# Patient Record
Sex: Female | Born: 1985 | Race: White | Hispanic: No | Marital: Married | Smoking: Never smoker
Health system: Southern US, Community
[De-identification: ages and names within clinical notes are randomized; demographics above are authoritative.]

## PROBLEM LIST (undated history)

## (undated) DIAGNOSIS — F3281 Premenstrual dysphoric disorder: Secondary | ICD-10-CM

## (undated) DIAGNOSIS — J45909 Unspecified asthma, uncomplicated: Secondary | ICD-10-CM

## (undated) DIAGNOSIS — E559 Vitamin D deficiency, unspecified: Secondary | ICD-10-CM

## (undated) DIAGNOSIS — R011 Cardiac murmur, unspecified: Secondary | ICD-10-CM

## (undated) HISTORY — PX: WISDOM TOOTH EXTRACTION: SHX21

## (undated) HISTORY — DX: Vitamin D deficiency, unspecified: E55.9

## (undated) HISTORY — DX: Premenstrual dysphoric disorder: F32.81

## (undated) HISTORY — PX: HERNIA REPAIR: SHX51

---
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
## 2014-08-16 ENCOUNTER — Ambulatory Visit (INDEPENDENT_AMBULATORY_CARE_PROVIDER_SITE_OTHER): Payer: BC Managed Care – PPO | Admitting: Physician Assistant

## 2014-08-16 ENCOUNTER — Encounter: Payer: Self-pay | Admitting: Physician Assistant

## 2014-08-16 VITALS — BP 119/77 | HR 95 | Ht 62.0 in | Wt 108.0 lb

## 2014-08-16 DIAGNOSIS — F418 Other specified anxiety disorders: Secondary | ICD-10-CM

## 2014-08-16 DIAGNOSIS — F419 Anxiety disorder, unspecified: Secondary | ICD-10-CM | POA: Insufficient documentation

## 2014-08-16 DIAGNOSIS — F329 Major depressive disorder, single episode, unspecified: Secondary | ICD-10-CM | POA: Insufficient documentation

## 2014-08-16 DIAGNOSIS — J45991 Cough variant asthma: Secondary | ICD-10-CM | POA: Diagnosis not present

## 2014-08-16 DIAGNOSIS — E785 Hyperlipidemia, unspecified: Secondary | ICD-10-CM

## 2014-08-16 DIAGNOSIS — Z23 Encounter for immunization: Secondary | ICD-10-CM | POA: Diagnosis not present

## 2014-08-16 DIAGNOSIS — K219 Gastro-esophageal reflux disease without esophagitis: Secondary | ICD-10-CM | POA: Insufficient documentation

## 2014-08-16 DIAGNOSIS — D509 Iron deficiency anemia, unspecified: Secondary | ICD-10-CM | POA: Diagnosis not present

## 2014-08-16 DIAGNOSIS — F32A Depression, unspecified: Secondary | ICD-10-CM

## 2014-08-16 DIAGNOSIS — J302 Other seasonal allergic rhinitis: Secondary | ICD-10-CM

## 2014-08-16 DIAGNOSIS — N943 Premenstrual tension syndrome: Secondary | ICD-10-CM

## 2014-08-16 DIAGNOSIS — F3281 Premenstrual dysphoric disorder: Secondary | ICD-10-CM

## 2014-08-16 MED ORDER — PANTOPRAZOLE SODIUM 20 MG PO TBEC: 40.0000 mg | DELAYED_RELEASE_TABLET | Freq: Every day | ORAL | Status: DC

## 2014-08-16 MED ORDER — SERTRALINE HCL 25 MG PO TABS: 25.0000 mg | ORAL_TABLET | Freq: Every day | ORAL | Status: DC

## 2014-08-16 NOTE — Progress Notes (Signed)
Subjective:    Patient ID: Melissa Mcclain, female    DOB: June 17, 1986, 28 y.o.   MRN: [id number]  HPI Patient is a 28 year old female presents to the clinic to establish care.  .. Active Ambulatory Problems    Diagnosis Date Noted  . IDA (iron deficiency anemia) 08/16/2014  . Seasonal allergies 08/16/2014  . Asthma, cough variant 08/16/2014  . PMDD (premenstrual dysphoric disorder) 08/16/2014  . Anxiety and depression 08/16/2014  . Gastroesophageal reflux disease without esophagitis 08/16/2014  . Hyperlipidemia 08/16/2014   Resolved Ambulatory Problems    Diagnosis Date Noted  . No Resolved Ambulatory Problems   No Additional Past Medical History   .Marland Kitchen Family History  Problem Relation Age of Onset  . Hyperlipidemia Mother   . Alcohol abuse Father   . Depression Brother   . Alcohol abuse Maternal Aunt   . Alcohol abuse Maternal Uncle   . Alcohol abuse Paternal Aunt   . Hyperlipidemia Maternal Grandmother   . Alcohol abuse Maternal Grandfather   . Cancer Maternal Grandfather   . Cancer Paternal Grandmother   . Alcohol abuse Paternal Aunt    .Marland Kitchen History   Social History  . Marital Status: Married    Spouse Name: N/A    Number of Children: N/A  . Years of Education: N/A   Occupational History  . Not on file.   Social History Main Topics  . Smoking status: Never Smoker   . Smokeless tobacco: Not on file  . Alcohol Use: 0.0 oz/week    0 Not specified per week  . Drug Use: No  . Sexual Activity: Yes   Other Topics Concern  . Not on file   Social History Narrative  . No narrative on file   Patient sees multiple doctors and really only needs me to manage her anxiety and depression as well as GERD.  Anxiety and depression she feels like is fairly controlled today at Zoloft 25 mg once daily. She has been on this for at least 2 years. She has tried going off it in the past and did not work out. She denies any suicidal or homicidal thoughts.  GERD-she's not  taking daily but taking on a regular basis. Her symptoms have resolved since starting protonic since.   Review of Systems  All other systems reviewed and are negative.      Objective:   Physical Exam  Constitutional: She is oriented to person, place, and time. She appears well-developed and well-nourished.  HENT:  Head: Normocephalic and atraumatic.  Cardiovascular: Normal rate, regular rhythm and normal heart sounds.   Pulmonary/Chest: Effort normal and breath sounds normal. She has no wheezes.  Neurological: She is alert and oriented to person, place, and time.  Skin: Skin is dry.  Psychiatric: She has a normal mood and affect. Her behavior is normal.          Assessment & Plan:  IDA-managed by her integrative homeopathic doctor.  Asthma with cough/seasonal allergies- managed by allergist Dr. Clarene Essex   anxiety and depression-GAD-7 was 14 pH Q9 was 5. Refilled Zoloft 25 mg discussed since numbers are not necessarily controlled increased. Patient declined at this time. Will refill for 6 months in follow-up as needed. Patient does want counseling if she were to become pregnant. I did tell her that her Zoloft is considered safe for pregnancy. If she does think pregnancy is in the future she should start a prenatal vitamin.  GERD-refilled Protonix. Reassured patient that protonic since safe for pregnancy.  She is aware that no studies were done during first trimester.  Flu shot given without complication. Tetanus shot given without complication.

## 2014-08-28 ENCOUNTER — Other Ambulatory Visit: Payer: Self-pay | Admitting: *Deleted

## 2014-08-28 MED ORDER — VITAMIN D (ERGOCALCIFEROL) 1.25 MG (50000 UNIT) PO CAPS: 50000.0000 [IU] | ORAL_CAPSULE | ORAL | Status: DC

## 2014-09-01 NOTE — L&D Delivery Note (Signed)
Para Asti is a 29 y.o. female G1P0 with IUP at [redacted]w[redacted]d by LMP presenting for contractions, found to be in latent labor wanting an water Birth initial. However, patient decided upon epidural. Augmented with pitocin, followed by AROM. Patient was complete and pushed for approximately 3 hours.   Delivery Note At 7:53 PM a viable female was delivered via Vaginal, Vacuum (Extractor) (Presentation: ; Occiput Anterior).  APGAR: 8,9 ; weight 8lb 2 oz Placenta status: Intact, Spontaneous.  Cord: 3 vessels with the following complications: None.  Cord pH: Not collected   Anesthesia: Epidural  Episiotomy: None Lacerations:  2nd degree  Suture Repair: 3.0 vicryl Est. Blood Loss (mL):  250  Mom to postpartum.  Baby to Couplet care / Skin to Skin.  Lockie Pares 08/20/2015, 8:22 PM  I performed the vacuum assisted delivery secondary to maternal exhaustion and repaired the 2nd degree laceration. Agree with above delivery note.

## 2014-12-05 ENCOUNTER — Telehealth: Payer: Self-pay | Admitting: Physician Assistant

## 2014-12-05 NOTE — Telephone Encounter (Signed)
Patient states her employer wants her to have labs.  She was wondering if you would put orders in for that bloodwork.  She needs basic labs and lipid panel.  I told her that if you needed to see her for some reason, i would let her know she needs appt.  Otherwise, let me know when labs are ordered and i will call her.  thanks

## 2014-12-06 ENCOUNTER — Telehealth: Payer: Self-pay | Admitting: *Deleted

## 2014-12-06 DIAGNOSIS — E785 Hyperlipidemia, unspecified: Secondary | ICD-10-CM

## 2014-12-06 DIAGNOSIS — D508 Other iron deficiency anemias: Secondary | ICD-10-CM

## 2014-12-06 NOTE — Telephone Encounter (Signed)
Ok to order lipid panel, cmp, cbc, ferritin, iron panel.

## 2014-12-06 NOTE — Telephone Encounter (Signed)
Labs ordered.

## 2014-12-15 ENCOUNTER — Ambulatory Visit (INDEPENDENT_AMBULATORY_CARE_PROVIDER_SITE_OTHER): Payer: BLUE CROSS/BLUE SHIELD | Admitting: Physician Assistant

## 2014-12-15 ENCOUNTER — Encounter: Payer: Self-pay | Admitting: Physician Assistant

## 2014-12-15 VITALS — BP 123/86 | HR 96 | Wt 102.0 lb

## 2014-12-15 DIAGNOSIS — Z331 Pregnant state, incidental: Secondary | ICD-10-CM

## 2014-12-15 DIAGNOSIS — Z3201 Encounter for pregnancy test, result positive: Secondary | ICD-10-CM

## 2014-12-15 DIAGNOSIS — Z3490 Encounter for supervision of normal pregnancy, unspecified, unspecified trimester: Secondary | ICD-10-CM

## 2014-12-15 LAB — CBC WITH DIFFERENTIAL/PLATELET
Basophils Absolute: 0 10*3/uL (ref 0.0–0.1)
Basophils Relative: 0 % (ref 0–1)
Eosinophils Absolute: 0.1 10*3/uL (ref 0.0–0.7)
Eosinophils Relative: 1 % (ref 0–5)
HCT: 41.6 % (ref 36.0–46.0)
Hemoglobin: 14.2 g/dL (ref 12.0–15.0)
Lymphocytes Relative: 36 % (ref 12–46)
Lymphs Abs: 2.3 10*3/uL (ref 0.7–4.0)
MCH: 32.2 pg (ref 26.0–34.0)
MCHC: 34.1 g/dL (ref 30.0–36.0)
MCV: 94.3 fL (ref 78.0–100.0)
MPV: 11.1 fL (ref 8.6–12.4)
Monocytes Absolute: 0.4 10*3/uL (ref 0.1–1.0)
Monocytes Relative: 7 % (ref 3–12)
Neutro Abs: 3.5 10*3/uL (ref 1.7–7.7)
Neutrophils Relative %: 56 % (ref 43–77)
Platelets: 248 10*3/uL (ref 150–400)
RBC: 4.41 MIL/uL (ref 3.87–5.11)
RDW: 12.7 % (ref 11.5–15.5)
WBC: 6.3 10*3/uL (ref 4.0–10.5)

## 2014-12-15 LAB — POCT URINE PREGNANCY: Preg Test, Ur: POSITIVE

## 2014-12-15 NOTE — Patient Instructions (Addendum)
First Trimester of Pregnancy The first trimester of pregnancy is from week 1 until the end of week 12 (months 1 through 3). A week after a sperm fertilizes an egg, the egg will implant on the wall of the uterus. This embryo will begin to develop into a baby. Genes from you and your partner are forming the baby. The female genes determine whether the baby is a boy or a girl. At 6-8 weeks, the eyes and face are formed, and the heartbeat can be seen on ultrasound. At the end of 12 weeks, all the baby's organs are formed.  Now that you are pregnant, you will want to do everything you can to have a healthy baby. Two of the most important things are to get good prenatal care and to follow your health care provider's instructions. Prenatal care is all the medical care you receive before the baby's birth. This care will help prevent, find, and treat any problems during the pregnancy and childbirth. BODY CHANGES Your body goes through many changes during pregnancy. The changes vary from woman to woman.   You may gain or lose a couple of pounds at first.  You may feel sick to your stomach (nauseous) and throw up (vomit). If the vomiting is uncontrollable, call your health care provider.  You may tire easily.  You may develop headaches that can be relieved by medicines approved by your health care provider.  You may urinate more often. Painful urination may mean you have a bladder infection.  You may develop heartburn as a result of your pregnancy.  You may develop constipation because certain hormones are causing the muscles that push waste through your intestines to slow down.  You may develop hemorrhoids or swollen, bulging veins (varicose veins).  Your breasts may begin to grow larger and become tender. Your nipples may stick out more, and the tissue that surrounds them (areola) may become darker.  Your gums may bleed and may be sensitive to brushing and flossing.  Dark spots or blotches (chloasma,  mask of pregnancy) may develop on your face. This will likely fade after the baby is born.  Your menstrual periods will stop.  You may have a loss of appetite.  You may develop cravings for certain kinds of food.  You may have changes in your emotions from day to day, such as being excited to be pregnant or being concerned that something may go wrong with the pregnancy and baby.  You may have more vivid and strange dreams.  You may have changes in your hair. These can include thickening of your hair, rapid growth, and changes in texture. Some women also have hair loss during or after pregnancy, or hair that feels dry or thin. Your hair will most likely return to normal after your baby is born. WHAT TO EXPECT AT YOUR PRENATAL VISITS During a routine prenatal visit:  You will be weighed to make sure you and the baby are growing normally.  Your blood pressure will be taken.  Your abdomen will be measured to track your baby's growth.  The fetal heartbeat will be listened to starting around week 10 or 12 of your pregnancy.  Test results from any previous visits will be discussed. Your health care provider may ask you:  How you are feeling.  If you are feeling the baby move.  If you have had any abnormal symptoms, such as leaking fluid, bleeding, severe headaches, or abdominal cramping.  If you have any questions. Other tests   that may be performed during your first trimester include:  Blood tests to find your blood type and to check for the presence of any previous infections. They will also be used to check for low iron levels (anemia) and Rh antibodies. Later in the pregnancy, blood tests for diabetes will be done along with other tests if problems develop.  Urine tests to check for infections, diabetes, or protein in the urine.  An ultrasound to confirm the proper growth and development of the baby.  An amniocentesis to check for possible genetic problems.  Fetal screens for  spina bifida and Down syndrome.  You may need other tests to make sure you and the baby are doing well. HOME CARE INSTRUCTIONS  Medicines  Follow your health care provider's instructions regarding medicine use. Specific medicines may be either safe or unsafe to take during pregnancy.  Take your prenatal vitamins as directed.  If you develop constipation, try taking a stool softener if your health care provider approves. Diet  Eat regular, well-balanced meals. Choose a variety of foods, such as meat or vegetable-based protein, fish, milk and low-fat dairy products, vegetables, fruits, and whole grain breads and cereals. Your health care provider will help you determine the amount of weight gain that is right for you.  Avoid raw meat and uncooked cheese. These carry germs that can cause birth defects in the baby.  Eating four or five small meals rather than three large meals a day may help relieve nausea and vomiting. If you start to feel nauseous, eating a few soda crackers can be helpful. Drinking liquids between meals instead of during meals also seems to help nausea and vomiting.  If you develop constipation, eat more high-fiber foods, such as fresh vegetables or fruit and whole grains. Drink enough fluids to keep your urine clear or pale yellow. Activity and Exercise  Exercise only as directed by your health care provider. Exercising will help you:  Control your weight.  Stay in shape.  Be prepared for labor and delivery.  Experiencing pain or cramping in the lower abdomen or low back is a good sign that you should stop exercising. Check with your health care provider before continuing normal exercises.  Try to avoid standing for long periods of time. Move your legs often if you must stand in one place for a long time.  Avoid heavy lifting.  Wear low-heeled shoes, and practice good posture.  You may continue to have sex unless your health care provider directs you  otherwise. Relief of Pain or Discomfort  Wear a good support bra for breast tenderness.   Take warm sitz baths to soothe any pain or discomfort caused by hemorrhoids. Use hemorrhoid cream if your health care provider approves.   Rest with your legs elevated if you have leg cramps or low back pain.  If you develop varicose veins in your legs, wear support hose. Elevate your feet for 15 minutes, 3-4 times a day. Limit salt in your diet. Prenatal Care  Schedule your prenatal visits by the twelfth week of pregnancy. They are usually scheduled monthly at first, then more often in the last 2 months before delivery.  Write down your questions. Take them to your prenatal visits.  Keep all your prenatal visits as directed by your health care provider. Safety  Wear your seat belt at all times when driving.  Make a list of emergency phone numbers, including numbers for family, friends, the hospital, and police and fire departments. General Tips    Ask your health care provider for a referral to a local prenatal education class. Begin classes no later than at the beginning of month 6 of your pregnancy.  Ask for help if you have counseling or nutritional needs during pregnancy. Your health care provider can offer advice or refer you to specialists for help with various needs.  Do not use hot tubs, steam rooms, or saunas.  Do not douche or use tampons or scented sanitary pads.  Do not cross your legs for long periods of time.  Avoid cat litter boxes and soil used by cats. These carry germs that can cause birth defects in the baby and possibly loss of the fetus by miscarriage or stillbirth.  Avoid all smoking, herbs, alcohol, and medicines not prescribed by your health care provider. Chemicals in these affect the formation and growth of the baby.  Schedule a dentist appointment. At home, brush your teeth with a soft toothbrush and be gentle when you floss. SEEK MEDICAL CARE IF:   You have  dizziness.  You have mild pelvic cramps, pelvic pressure, or nagging pain in the abdominal area.  You have persistent nausea, vomiting, or diarrhea.  You have a bad smelling vaginal discharge.  You have pain with urination.  You notice increased swelling in your face, hands, legs, or ankles. SEEK IMMEDIATE MEDICAL CARE IF:   You have a fever.  You are leaking fluid from your vagina.  You have spotting or bleeding from your vagina.  You have severe abdominal cramping or pain.  You have rapid weight gain or loss.  You vomit blood or material that looks like coffee grounds.  You are exposed to [place] measles and have never had them.  You are exposed to fifth disease or chickenpox.  You develop a severe headache.  You have shortness of breath.  You have any kind of trauma, such as from a fall or a car accident. Document Released: 08/12/2001 Document Revised: 01/02/2014 Document Reviewed: 06/28/2013 [facility] Patient Information 2015 [place], [place]. This information is not intended to replace advice given to you by your health care provider. Make sure you discuss any questions you have with your health care provider.   B6/unisom Phenergan reglan

## 2014-12-15 NOTE — Progress Notes (Signed)
   Subjective:    Patient ID: Melissa Mcclain, female    DOB: 08-04-1986, 29 y.o.   MRN: [id number]  HPI  Pt presents to the clinic to confirm pregnancy. She has not been trying but not preventing. LMP was 11/07/14 and she has had 2 home pregnancy positives. Denies any nausea or vomiting. Mild cramping a few days ago but no vaginal bleeding or abnormal discharge. She does not have an OB.      Review of Systems  All other systems reviewed and are negative.      Objective:   Physical Exam  Constitutional: She appears well-developed and well-nourished.  Cardiovascular: Normal rate, regular rhythm and normal heart sounds.   Psychiatric: She has a normal mood and affect. Her behavior is normal.          Assessment & Plan:  Pregnancy- UPT positive today. Confirmed pregnancy. Pt is taking MVI. Looking over her medication list no tetrogenic drugs. Discussed NO NSAIDs during pregnancy. Gave HO. Concerned about Zika virus this summer. Discussed decrease in transmission by prevention. Deet in pregnancy HO was given. Low doses should be ok but I would avoid all medications and unknown the harm in first trimester until 13 weeks.  Discussed candles and organic repellant. Needs to find OB for 8 week appt. She is approximately 5 weeks and 3 days. Discussed some of common symptoms or pregnancy. Certainly if not established to OB by then call us for more help.

## 2014-12-16 LAB — LIPID PANEL
Cholesterol: 269 mg/dL — ABNORMAL HIGH (ref 0–200)
HDL: 86 mg/dL (ref >=46)
LDL Cholesterol: 171 mg/dL — ABNORMAL HIGH (ref 0–99)
Total CHOL/HDL Ratio: 3.1 Ratio
Triglycerides: 61 mg/dL (ref <150)
VLDL: 12 mg/dL (ref 0–40)

## 2014-12-16 LAB — COMPLETE METABOLIC PANEL WITH GFR
ALT: 9 U/L (ref 0–35)
AST: 13 U/L (ref 0–37)
Albumin: 4.7 g/dL (ref 3.5–5.2)
Alkaline Phosphatase: 58 U/L (ref 39–117)
BUN: 12 mg/dL (ref 6–23)
CO2: 22 mEq/L (ref 19–32)
Calcium: 9.8 mg/dL (ref 8.4–10.5)
Chloride: 102 mEq/L (ref 96–112)
Creat: 0.64 mg/dL (ref 0.50–1.10)
GFR, Est African American: >89 mL/min
GFR, Est Non African American: >89 mL/min
Glucose, Bld: 78 mg/dL (ref 70–99)
Potassium: 4 mEq/L (ref 3.5–5.3)
Sodium: 136 mEq/L (ref 135–145)
Total Bilirubin: 0.5 mg/dL (ref 0.2–1.2)
Total Protein: 7.1 g/dL (ref 6.0–8.3)

## 2014-12-16 LAB — IRON AND TIBC
%SAT: 24 % (ref 20–55)
Iron: 74 ug/dL (ref 42–145)
TIBC: 305 ug/dL (ref 250–470)
UIBC: 231 ug/dL (ref 125–400)

## 2014-12-16 LAB — FERRITIN: Ferritin: 42 ng/mL (ref 10–291)

## 2014-12-17 DIAGNOSIS — Z3490 Encounter for supervision of normal pregnancy, unspecified, unspecified trimester: Secondary | ICD-10-CM | POA: Insufficient documentation

## 2015-01-09 ENCOUNTER — Telehealth: Payer: Self-pay | Admitting: *Deleted

## 2015-01-09 NOTE — Telephone Encounter (Signed)
Pt left vm asking if you'd received forms to fill out for her ins?

## 2015-01-10 NOTE — Telephone Encounter (Signed)
I did fill out these forms and put them in nurse box at my station if she is referring to physical information.

## 2015-01-18 ENCOUNTER — Ambulatory Visit (INDEPENDENT_AMBULATORY_CARE_PROVIDER_SITE_OTHER): Payer: BLUE CROSS/BLUE SHIELD | Admitting: Obstetrics & Gynecology

## 2015-01-18 ENCOUNTER — Encounter: Payer: Self-pay | Admitting: Obstetrics & Gynecology

## 2015-01-18 VITALS — Wt 105.0 lb

## 2015-01-18 DIAGNOSIS — Z1151 Encounter for screening for human papillomavirus (HPV): Secondary | ICD-10-CM | POA: Diagnosis not present

## 2015-01-18 DIAGNOSIS — Z124 Encounter for screening for malignant neoplasm of cervix: Secondary | ICD-10-CM | POA: Diagnosis not present

## 2015-01-18 DIAGNOSIS — Z3401 Encounter for supervision of normal first pregnancy, first trimester: Secondary | ICD-10-CM

## 2015-01-18 DIAGNOSIS — Z3400 Encounter for supervision of normal first pregnancy, unspecified trimester: Secondary | ICD-10-CM | POA: Insufficient documentation

## 2015-01-18 DIAGNOSIS — Z3402 Encounter for supervision of normal first pregnancy, second trimester: Secondary | ICD-10-CM

## 2015-01-18 DIAGNOSIS — Z113 Encounter for screening for infections with a predominantly sexual mode of transmission: Secondary | ICD-10-CM | POA: Diagnosis not present

## 2015-01-18 NOTE — Progress Notes (Signed)
Pt here for initial prenatal visit. Bedside US shows single IUP [redacted]w[redacted]d by CRL and FHR 173.

## 2015-01-18 NOTE — Progress Notes (Signed)
   Subjective:    Melissa Mcclain is a MW  G1P0 [redacted]w[redacted]d being seen today for her first obstetrical visit.  Her obstetrical history is significant for none. Patient does intend to breast feed. Pregnancy history fully reviewed.  Patient reports no complaints.  Filed Vitals:   01/18/15 1531  Weight: 105 lb (47.628 kg)    HISTORY: OB History  Gravida Para Term Preterm AB SAB TAB Ectopic Multiple Living  1             # Outcome Date GA Lbr Len/2nd Weight Sex Delivery Anes PTL Lv  1 Current              History reviewed. No pertinent past medical history. Past Surgical History  Procedure Laterality Date  . Tonsilectomy/adenoidectomy with myringotomy  1996  . Hernia repair  [phone]   Family History  Problem Relation Age of Onset  . Hyperlipidemia Mother   . Alcohol abuse Father   . Depression Brother   . Alcohol abuse Maternal Aunt   . Alcohol abuse Maternal Uncle   . Alcohol abuse Paternal Aunt   . Hyperlipidemia Maternal Grandmother   . Alcohol abuse Maternal Grandfather   . Cancer Maternal Grandfather   . Cancer Paternal Grandmother   . Alcohol abuse Paternal Aunt      Exam    Uterus:     Pelvic Exam:    Perineum: No Hemorrhoids   Vulva: normal   Vagina:  normal mucosa   pH:    Cervix: anteverted   Adnexa: normal adnexa   Bony Pelvis: android  System: Breast:  normal appearance, no masses or tenderness   Skin: normal coloration and turgor, no rashes    Neurologic: oriented   Extremities: normal strength, tone, and muscle mass   HEENT PERRLA   Mouth/Teeth mucous membranes moist, pharynx normal without lesions   Neck supple   Cardiovascular: regular rate and rhythm   Respiratory:  appears well, vitals normal, no respiratory distress, acyanotic, normal RR, ear and throat exam is normal, neck free of mass or lymphadenopathy, chest clear, no wheezing, crepitations, rhonchi, normal symmetric air entry   Abdomen: soft, non-tender; bowel sounds normal; no masses,   no organomegaly   Urinary: urethral meatus normal      Assessment:    Pregnancy: G1P0 Patient Active Problem List   Diagnosis Date Noted  . Supervision of normal first pregnancy 01/18/2015  . Pregnant 12/17/2014  . IDA (iron deficiency anemia) 08/16/2014  . Seasonal allergies 08/16/2014  . Asthma, cough variant 08/16/2014  . PMDD (premenstrual dysphoric disorder) 08/16/2014  . Anxiety and depression 08/16/2014  . Gastroesophageal reflux disease without esophagitis 08/16/2014  . Hyperlipidemia 08/16/2014        Plan:     Initial labs drawn. Prenatal vitamins. Problem list reviewed and updated. Genetic Screening discussed First Screen and Quad Screen: undecided.  Ultrasound discussed; fetal survey: requested.  Follow up in 4 weeks. She will call in a few days if she wants the [place] [facility]. 01/18/2015

## 2015-01-19 LAB — PRENATAL PROFILE (SOLSTAS)
Antibody Screen: NEGATIVE
Basophils Absolute: 0 10*3/uL (ref 0.0–0.1)
Basophils Relative: 0 % (ref 0–1)
Eosinophils Absolute: 0.1 10*3/uL (ref 0.0–0.7)
Eosinophils Relative: 1 % (ref 0–5)
HCT: 37.4 % (ref 36.0–46.0)
HIV 1&2 Ab, 4th Generation: NONREACTIVE
Hemoglobin: 12.6 g/dL (ref 12.0–15.0)
Hepatitis B Surface Ag: NEGATIVE
Lymphocytes Relative: 26 % (ref 12–46)
Lymphs Abs: 2.1 10*3/uL (ref 0.7–4.0)
MCH: 31.3 pg (ref 26.0–34.0)
MCHC: 33.7 g/dL (ref 30.0–36.0)
MCV: 92.8 fL (ref 78.0–100.0)
MPV: 10.4 fL (ref 8.6–12.4)
Monocytes Absolute: 0.6 10*3/uL (ref 0.1–1.0)
Monocytes Relative: 8 % (ref 3–12)
Neutro Abs: 5.1 10*3/uL (ref 1.7–7.7)
Neutrophils Relative %: 65 % (ref 43–77)
Platelets: 255 10*3/uL (ref 150–400)
RBC: 4.03 MIL/uL (ref 3.87–5.11)
RDW: 13.2 % (ref 11.5–15.5)
Rh Type: POSITIVE
Rubella: 6.65 Index — ABNORMAL HIGH (ref <0.90)
WBC: 7.9 10*3/uL (ref 4.0–10.5)

## 2015-01-20 LAB — CULTURE, OB URINE
Colony Count: NO GROWTH
Organism ID, Bacteria: NO GROWTH

## 2015-01-22 LAB — CYTOLOGY - PAP

## 2015-01-23 LAB — CYSTIC FIBROSIS DIAGNOSTIC STUDY: Result Summary:: DETECTED — AB

## 2015-01-24 ENCOUNTER — Telehealth: Payer: Self-pay | Admitting: *Deleted

## 2015-01-24 NOTE — Telephone Encounter (Signed)
Pt notified of positive CF mutation.  Husband is to come in for CF testing and if positive will schedule for genetic counseling.

## 2015-01-26 ENCOUNTER — Encounter: Payer: Self-pay | Admitting: Obstetrics & Gynecology

## 2015-01-26 DIAGNOSIS — Z141 Cystic fibrosis carrier: Secondary | ICD-10-CM | POA: Insufficient documentation

## 2015-02-15 ENCOUNTER — Ambulatory Visit (INDEPENDENT_AMBULATORY_CARE_PROVIDER_SITE_OTHER): Payer: BLUE CROSS/BLUE SHIELD | Admitting: Obstetrics & Gynecology

## 2015-02-15 ENCOUNTER — Encounter: Payer: Self-pay | Admitting: Obstetrics & Gynecology

## 2015-02-15 VITALS — BP 110/73 | HR 92 | Wt 107.0 lb

## 2015-02-15 DIAGNOSIS — Z3402 Encounter for supervision of normal first pregnancy, second trimester: Secondary | ICD-10-CM

## 2015-02-15 NOTE — Addendum Note (Signed)
Addended by: Asencion Islam on: 02/15/2015 04:17 PM   Modules accepted: Orders

## 2015-02-15 NOTE — Progress Notes (Signed)
Routine visit. No problems. Declines genetic testing. Her husband will come in to have his blood drawn to see if he is a CF carrier. His schedule has not allowed this as of yet. Schedule anatomy u/s for 18 weeks. She is interested in a water birth and will take the classes.

## 2015-02-28 ENCOUNTER — Telehealth: Payer: Self-pay

## 2015-02-28 NOTE — Telephone Encounter (Signed)
Patient called and left a message on nurse line asking for a return call.   Returned Call: Left message asking patient to call back.  

## 2015-03-01 NOTE — Telephone Encounter (Signed)
Pregnancy category B, yes this is safe.

## 2015-03-01 NOTE — Telephone Encounter (Signed)
Melissa Mcclain would like to know if she can take Protonix while pregnant.

## 2015-03-02 NOTE — Telephone Encounter (Signed)
Left a message advising of recommendations.  

## 2015-03-12 ENCOUNTER — Ambulatory Visit (INDEPENDENT_AMBULATORY_CARE_PROVIDER_SITE_OTHER): Payer: BLUE CROSS/BLUE SHIELD | Admitting: Family Medicine

## 2015-03-12 ENCOUNTER — Encounter: Payer: Self-pay | Admitting: Family Medicine

## 2015-03-12 VITALS — BP 105/67 | HR 110 | Wt 108.0 lb

## 2015-03-12 DIAGNOSIS — B86 Scabies: Secondary | ICD-10-CM

## 2015-03-12 DIAGNOSIS — H6121 Impacted cerumen, right ear: Secondary | ICD-10-CM

## 2015-03-12 DIAGNOSIS — H6120 Impacted cerumen, unspecified ear: Secondary | ICD-10-CM | POA: Insufficient documentation

## 2015-03-12 MED ORDER — PERMETHRIN 5 % EX CREA: 1.0000 "application " | TOPICAL_CREAM | Freq: Once | CUTANEOUS | Status: DC

## 2015-03-12 MED ORDER — TRIAMCINOLONE ACETONIDE 0.1 % EX CREA: 1.0000 "application " | TOPICAL_CREAM | Freq: Two times a day (BID) | CUTANEOUS | Status: DC | PRN

## 2015-03-12 NOTE — Assessment & Plan Note (Signed)
Improved with irrigation 

## 2015-03-12 NOTE — Patient Instructions (Signed)
Thank you for coming in today. Apply the permethrin cream from the neck down at night and wash off in the morning. Use Benadryl at night as needed for itching. Use [place] Itch as needed. Come back if not getting better or worsening. Apply the triamcinolone as needed.   Scabies Scabies are small bugs (mites) that burrow under the skin and cause red bumps and severe itching. These bugs can only be seen with a microscope. Scabies are highly contagious. They can spread easily from person to person by direct contact. They are also spread through sharing clothing or linens that have the scabies mites living in them. It is not unusual for an entire family to become infected through shared towels, clothing, or bedding.  HOME CARE INSTRUCTIONS   Your caregiver may prescribe a cream or lotion to kill the mites. If cream is prescribed, massage the cream into the entire body from the neck to the bottom of both feet. Also massage the cream into the scalp and face if your child is less than 29 year old. Avoid the eyes and mouth. Do not wash your hands after application.  Leave the cream on for 8 to 12 hours. Your child should bathe or shower after the 8 to 12 hour application period. Sometimes it is helpful to apply the cream to your child right before bedtime.  One treatment is usually effective and will eliminate approximately 95% of infestations. For severe cases, your caregiver may decide to repeat the treatment in 1 week. Everyone in your household should be treated with one application of the cream.  New rashes or burrows should not appear within 24 to 48 hours after successful treatment. However, the itching and rash may last for 2 to 4 weeks after successful treatment. Your caregiver may prescribe a medicine to help with the itching or to help the rash go away more quickly.  Scabies can live on clothing or linens for up to 3 days. All of your child's recently used clothing, towels, stuffed toys, and  bed linens should be washed in hot water and then dried in a dryer for at least 20 minutes on high heat. Items that cannot be washed should be enclosed in a plastic bag for at least 3 days.  To help relieve itching, bathe your child in a cool bath or apply cool washcloths to the affected areas.  Your child may return to school after treatment with the prescribed cream. SEEK MEDICAL CARE IF:   The itching persists longer than 4 weeks after treatment.  The rash spreads or becomes infected. Signs of infection include red blisters or yellow-tan crust. Document Released: 08/18/2005 Document Revised: 11/10/2011 Document Reviewed: 12/27/2008 [facility] Patient Information 2015 [place], [place]. This information is not intended to replace advice given to you by your health care provider. Make sure you discuss any questions you have with your health care provider.

## 2015-03-12 NOTE — Progress Notes (Signed)
Melissa Mcclain is a 30 y.o. female who presents to [place] today for rash. Patient notes an itchy rash for several weeks. She and her husband and MIL have similar rash. She has had the house inspected for bedbugs, but none were found. The house has been treated with insecticide and she washes the sheets daily. She has been using sulfur soap which helps. She suspects scabies. She is currently [redacted] weeks pregnant with a G1P0. She also notes right ear fullness for a few weeks.    History reviewed. No pertinent past medical history.  History  Substance Use Topics  . Smoking status: Never Smoker   . Smokeless tobacco: Not on file  . Alcohol Use: 0.0 oz/week    0 Standard drinks or equivalent per week   ROS as above  Medications reviewed. Current Outpatient Prescriptions  Medication Sig Dispense Refill  . Prenatal Vit-Fe Fumarate-FA (MULTIVITAMIN-PRENATAL) 27-0.8 MG TABS tablet Take 1 tablet by mouth daily at 12 noon.    . permethrin (ELIMITE) 5 % cream Apply 1 application topically once. 180 g 1  . triamcinolone cream (KENALOG) 0.1 % Apply 1 application topically 2 (two) times daily as needed. itching 453.6 g 1   No current facility-administered medications for this visit.    Exam:  BP 105/67 mmHg  Pulse 110  Wt 108 lb (48.988 kg)  LMP 11/07/2014 Gen: Well NAD HEENT: EOMI,  MMM right ear occluded with cerumen.  Lungs: CTABL Nl WOB Heart: Mild tachy no MRG Abd: NABS, NT, ND Exts: Non edematous BL  LE, warm and well perfused.  Skin: Small erythematous papules on body.  Cerumen irrigated. Pt felt better.   No results found for this or any previous visit (from the past 72 hour(s)).

## 2015-03-12 NOTE — Assessment & Plan Note (Signed)
Treat with permetherin and triamcinolone

## 2015-03-20 ENCOUNTER — Ambulatory Visit (HOSPITAL_COMMUNITY): Payer: BLUE CROSS/BLUE SHIELD

## 2015-03-22 ENCOUNTER — Ambulatory Visit (HOSPITAL_COMMUNITY)
Admission: RE | Admit: 2015-03-22 | Discharge: 2015-03-22 | Disposition: A | Discharge status: Home / Self Care | Payer: BLUE CROSS/BLUE SHIELD | Source: Ambulatory Visit | Attending: Obstetrics & Gynecology | Admitting: Obstetrics & Gynecology

## 2015-03-22 ENCOUNTER — Other Ambulatory Visit: Payer: Self-pay | Admitting: Obstetrics & Gynecology

## 2015-03-22 ENCOUNTER — Other Ambulatory Visit (HOSPITAL_COMMUNITY): Payer: Self-pay | Admitting: Obstetrics and Gynecology

## 2015-03-22 ENCOUNTER — Encounter: Payer: BLUE CROSS/BLUE SHIELD | Admitting: Obstetrics & Gynecology

## 2015-03-22 DIAGNOSIS — IMO0001 Reserved for inherently not codable concepts without codable children: Secondary | ICD-10-CM

## 2015-03-22 DIAGNOSIS — IMO0002 Reserved for concepts with insufficient information to code with codable children: Secondary | ICD-10-CM

## 2015-03-22 DIAGNOSIS — Z3A19 19 weeks gestation of pregnancy: Secondary | ICD-10-CM | POA: Insufficient documentation

## 2015-03-22 DIAGNOSIS — Z3402 Encounter for supervision of normal first pregnancy, second trimester: Secondary | ICD-10-CM

## 2015-03-22 DIAGNOSIS — Z0489 Encounter for examination and observation for other specified reasons: Secondary | ICD-10-CM

## 2015-03-26 ENCOUNTER — Ambulatory Visit (INDEPENDENT_AMBULATORY_CARE_PROVIDER_SITE_OTHER): Payer: BLUE CROSS/BLUE SHIELD | Admitting: Obstetrics & Gynecology

## 2015-03-26 VITALS — BP 119/74 | HR 107 | Wt 111.0 lb

## 2015-03-26 DIAGNOSIS — R309 Painful micturition, unspecified: Secondary | ICD-10-CM

## 2015-03-26 DIAGNOSIS — Z3402 Encounter for supervision of normal first pregnancy, second trimester: Secondary | ICD-10-CM

## 2015-03-26 LAB — POCT URINALYSIS DIPSTICK
Bilirubin, UA: NEGATIVE
Blood, UA: NEGATIVE
Glucose, UA: NEGATIVE
Ketones, UA: NEGATIVE
Leukocytes, UA: NEGATIVE
Nitrite, UA: NEGATIVE
Protein, UA: NEGATIVE
Spec Grav, UA: <=1.005
Urobilinogen, UA: NEGATIVE
pH, UA: 7

## 2015-03-26 NOTE — Progress Notes (Signed)
Subjective:  Melissa Mcclain is a 29 y.o. G1P0 at [redacted]w[redacted]d being seen today for ongoing prenatal care.  Patient reports no complaints.  Contractions: Not present.  Vag. Bleeding: None. Movement: Present. Denies leaking of fluid.   The following portions of the patient's history were reviewed and updated as appropriate: allergies, current medications, past family history, past medical history, past social history, past surgical history and problem list.   Objective:   Filed Vitals:   03/26/15 1545  BP: 119/74  Pulse: 107  Weight: 111 lb (50.349 kg)    Fetal Status:     Movement: Present     General:  Alert, oriented and cooperative. Patient is in no acute distress.  Skin: Skin is warm and dry. No rash noted.   Cardiovascular: Normal heart rate noted  Respiratory: Normal respiratory effort, no problems with respiration noted  Abdomen: Soft, gravid, appropriate for gestational age. Pain/Pressure: Absent     Vaginal: Vag. Bleeding: None.    Vag D/C Character: Thin  Cervix: Not evaluated        Extremities: Normal range of motion.  Edema: None  Mental Status: Normal mood and affect. Normal behavior. Normal judgment and thought content.   Urinalysis: Urine Protein: Negative Urine Glucose: Negative  Assessment and Plan:  Pregnancy: G1P0 at [redacted]w[redacted]d  1. Urinary pain - POCT Urinalysis Dipstick  Term labor symptoms and general obstetric precautions including but not limited to vaginal bleeding, contractions, leaking of fluid and fetal movement were reviewed in detail with the patient. Please refer to After Visit Summary for other counseling recommendations.  She will get NIPS drawn today. Her husband will soon find time to get CF drawn.   No Follow-up on file.   Emily Filbert, MD

## 2015-04-03 ENCOUNTER — Encounter: Payer: Self-pay | Admitting: *Deleted

## 2015-04-16 ENCOUNTER — Other Ambulatory Visit: Payer: Self-pay | Admitting: Physician Assistant

## 2015-04-16 DIAGNOSIS — L299 Pruritus, unspecified: Secondary | ICD-10-CM

## 2015-04-16 NOTE — Progress Notes (Signed)
Pt called requesting a referral to dermatology for itching. Referral ordered.

## 2015-04-23 ENCOUNTER — Encounter: Payer: Self-pay | Admitting: *Deleted

## 2015-04-23 ENCOUNTER — Ambulatory Visit (INDEPENDENT_AMBULATORY_CARE_PROVIDER_SITE_OTHER): Payer: BLUE CROSS/BLUE SHIELD | Admitting: Advanced Practice Midwife

## 2015-04-23 VITALS — BP 113/64 | HR 104 | Wt 115.0 lb

## 2015-04-23 DIAGNOSIS — Z3402 Encounter for supervision of normal first pregnancy, second trimester: Secondary | ICD-10-CM

## 2015-04-23 NOTE — Patient Instructions (Signed)
Thinking About Waterbirth???  You must attend a Waterbirth class at [facility]  3rd Wednesday of every month from 7-9pm  Free  Register by calling [phone] or online at [email or web address]  Bring us the certificate from the class  Waterbirth supplies needed for [facility] patients:  Our practice has a Birth Pool in a Box tub at the hospital that you can borrow  You will need to purchase an accessory kit that has all needed supplies through [facility] (  ) or online  Or you can purchase the supplies separately: o Single-use disposable tub liner for Birth Pool in a Box (REGULAR size) o New garden hose labeled "lead-free", "suitable for drinking water", "non-toxic" OR "water potable" o Garden hose to remove the dirty water o Electric drain pump to remove water (We recommend 792 gallon per hour or greater pump.)  o Fish net o Bathing suit top (optional) o Long-handled mirror (optional)  [email or web address] sells tubs for ~ $120 if you would rather purchase your own tub  [facility] ([email or web address]) $275 for tub rental/set-up & take down/kit   Things that would prevent you from having a waterbirth:  Premature, <37wks  Previous cesarean birth  Presence of thick meconium-stained fluid  Multiple gestation (Twins, triplets, etc.)  Uncontrolled diabetes  Hypertension  Heavy vaginal bleeding  Non-reassuring fetal heart rate  Active infection (MRSA, etc.)  If your labor has to be induced  Other risk issues identified by your obstetrical provider    

## 2015-04-23 NOTE — Progress Notes (Signed)
Subjective:  Melissa Mcclain is a 29 y.o. G1P0 at [redacted]w[redacted]d being seen today for ongoing prenatal care.  Patient reports bilateral sharp pains with movement.  Contractions: Not present.  Vag. Bleeding: None. Movement: Present. Denies leaking of fluid.   The following portions of the patient's history were reviewed and updated as appropriate: allergies, current medications, past family history, past medical history, past social history, past surgical history and problem list.   Objective:   Filed Vitals:   04/23/15 0902  BP: 113/64  Pulse: 104  Weight: 115 lb (52.164 kg)    Fetal Status: Fetal Heart Rate (bpm): 145   Movement: Present     General:  Alert, oriented and cooperative. Patient is in no acute distress.  Skin: Skin is warm and dry. No rash noted.   Cardiovascular: Normal heart rate noted  Respiratory: Normal respiratory effort, no problems with respiration noted  Abdomen: Soft, gravid, appropriate for gestational age. Pain/Pressure: Absent     Pelvic: Vag. Bleeding: None Vag D/C Character: Thin   Cervical exam deferred        Extremities: Normal range of motion.  Edema: None  Mental Status: Normal mood and affect. Normal behavior. Normal judgment and thought content.   Urinalysis: Urine Protein: Negative Urine Glucose: Negative  Assessment and Plan:  Pregnancy: G1P0 at [redacted]w[redacted]d  Round ligament pain  Preterm labor symptoms and general obstetric precautions including but not limited to vaginal bleeding, contractions, leaking of fluid and fetal movement were reviewed in detail with the patient. Waterbirth consent done, questions answered.  Please refer to After Visit Summary for other counseling recommendations.  No Follow-up on file.   Elvera Maria, CNM

## 2015-05-21 ENCOUNTER — Ambulatory Visit (INDEPENDENT_AMBULATORY_CARE_PROVIDER_SITE_OTHER): Payer: BLUE CROSS/BLUE SHIELD | Admitting: Advanced Practice Midwife

## 2015-05-21 VITALS — BP 108/69 | HR 104 | Wt 119.0 lb

## 2015-05-21 DIAGNOSIS — Z3402 Encounter for supervision of normal first pregnancy, second trimester: Secondary | ICD-10-CM

## 2015-05-21 DIAGNOSIS — Z23 Encounter for immunization: Secondary | ICD-10-CM

## 2015-05-21 DIAGNOSIS — Z3490 Encounter for supervision of normal pregnancy, unspecified, unspecified trimester: Secondary | ICD-10-CM

## 2015-05-21 MED ORDER — TETANUS-DIPHTH-ACELL PERTUSSIS 5-2.5-18.5 LF-MCG/0.5 IM SUSP
0.5000 mL | Freq: Once | INTRAMUSCULAR | Status: AC
  Administered 2015-05-21: 0.5 mL via INTRAMUSCULAR

## 2015-05-21 MED ORDER — INFLUENZA VAC SPLIT QUAD 0.5 ML IM SUSY
0.5000 mL | PREFILLED_SYRINGE | Freq: Once | INTRAMUSCULAR | Status: AC
  Administered 2015-05-21: 0.5 mL via INTRAMUSCULAR

## 2015-05-21 NOTE — Progress Notes (Signed)
Subjective:  Melissa Mcclain is a 29 y.o. G1P0 at [redacted]w[redacted]d being seen today for ongoing prenatal care.  Patient reports worsening itching that started early in pregnancy. Rare pink bumps, but no other rash. Has seen dematologist. One Dr Tx her for scabes, but other Dr Samuella Bruin it was not. No improvement. Has Triamcinolone, but hasn't tkend. Wasn't sure it was safe in pregnancy. .  Contractions: Not present.  Vag. Bleeding: None. Movement: Absent. Denies leaking of fluid.   The following portions of the patient's history were reviewed and updated as appropriate: allergies, current medications, past family history, past medical history, past social history, past surgical history and problem list.   Objective:   Filed Vitals:   05/21/15 0912  BP: 108/69  Pulse: 104  Weight: 119 lb (53.978 kg)    Fetal Status: Fetal Heart Rate (bpm): 143 Fundal Height: 27 cm Movement: Absent     General:  Alert, oriented and cooperative. Patient is in no acute distress.  Skin: Skin is warm and dry. No rash noted.   Cardiovascular: Normal heart rate noted  Respiratory: Normal respiratory effort, no problems with respiration noted  Abdomen: Soft, gravid, appropriate for gestational age. Pain/Pressure: Absent     Pelvic: Vag. Bleeding: None Vag D/C Character: Thin   Cervical exam deferred        Extremities: Normal range of motion.  Edema: None  Mental Status: Normal mood and affect. Normal behavior. Normal judgment and thought content.   Urinalysis: Urine Protein: Negative Urine Glucose: Negative  Assessment and Plan:  Pregnancy: G1P0 at [redacted]w[redacted]d  1. Supervision of normal pregnancy, unspecified trimester  - Glucose Tolerance, 1 HR (50g) - CBC - RPR - HIV antibody (with reflex) - Tdap (BOOSTRIX) injection 0.5 mL; Inject 0.5 mLs into the muscle once. - Influenza vac split quadrivalent PF (FLUARIX) injection 0.5 mL; Inject 0.5 mLs into the muscle once.  2. Encounter for supervision of normal first pregnancy in  second trimester  3. Pruritus in pregnancy  - Consider drawing bile acids if itching worsens and seems to occur without rash   Preterm labor symptoms and general obstetric precautions including but not limited to vaginal bleeding, contractions, leaking of fluid and fetal movement were reviewed in detail with the patient. Please refer to After Visit Summary for other counseling recommendations.  Return in about 2 weeks (around 06/04/2015).   Manya Silvas, CNM

## 2015-05-22 ENCOUNTER — Other Ambulatory Visit: Payer: BLUE CROSS/BLUE SHIELD

## 2015-05-22 LAB — CBC
HCT: 33.5 % — ABNORMAL LOW (ref 36.0–46.0)
Hemoglobin: 11.3 g/dL — ABNORMAL LOW (ref 12.0–15.0)
MCH: 32.2 pg (ref 26.0–34.0)
MCHC: 33.7 g/dL (ref 30.0–36.0)
MCV: 95.4 fL (ref 78.0–100.0)
MPV: 10 fL (ref 8.6–12.4)
Platelets: 201 10*3/uL (ref 150–400)
RBC: 3.51 MIL/uL — ABNORMAL LOW (ref 3.87–5.11)
RDW: 13.7 % (ref 11.5–15.5)
WBC: 6.4 10*3/uL (ref 4.0–10.5)

## 2015-05-23 ENCOUNTER — Telehealth: Payer: Self-pay | Admitting: *Deleted

## 2015-05-23 LAB — HIV ANTIBODY (ROUTINE TESTING W REFLEX): HIV 1&2 Ab, 4th Generation: NONREACTIVE

## 2015-05-23 LAB — RPR

## 2015-05-23 LAB — GLUCOSE TOLERANCE, 1 HOUR (50G) W/O FASTING: Glucose, 1 Hour GTT: 110 mg/dL (ref 70–140)

## 2015-05-23 NOTE — Telephone Encounter (Signed)
LM on voicemail of normal 1 hr GTT. 

## 2015-05-25 ENCOUNTER — Encounter: Payer: Self-pay | Admitting: Advanced Practice Midwife

## 2015-06-04 ENCOUNTER — Encounter: Payer: Self-pay | Admitting: Advanced Practice Midwife

## 2015-06-04 ENCOUNTER — Ambulatory Visit (INDEPENDENT_AMBULATORY_CARE_PROVIDER_SITE_OTHER): Payer: BLUE CROSS/BLUE SHIELD | Admitting: Advanced Practice Midwife

## 2015-06-04 VITALS — BP 93/57 | HR 104 | Wt 120.0 lb

## 2015-06-04 DIAGNOSIS — R252 Cramp and spasm: Secondary | ICD-10-CM

## 2015-06-04 DIAGNOSIS — Z3403 Encounter for supervision of normal first pregnancy, third trimester: Secondary | ICD-10-CM

## 2015-06-04 DIAGNOSIS — Z3402 Encounter for supervision of normal first pregnancy, second trimester: Secondary | ICD-10-CM

## 2015-06-04 DIAGNOSIS — Z3490 Encounter for supervision of normal pregnancy, unspecified, unspecified trimester: Secondary | ICD-10-CM

## 2015-06-04 NOTE — Progress Notes (Signed)
Subjective:  Melissa Mcclain is a 29 y.o. G1P0 at [redacted]w[redacted]d being seen today for ongoing prenatal care.  Patient reports no complaints and States itching has resolved.  Has calf cramps at night. Baby moves a lot.  Contractions: Not present.  Vag. Bleeding: None. Movement: Present. Denies leaking of fluid.   The following portions of the patient's history were reviewed and updated as appropriate: allergies, current medications, past family history, past medical history, past social history, past surgical history and problem list.   Objective:   Filed Vitals:   06/04/15 0901  BP: 93/57  Pulse: 104  Weight: 120 lb (54.432 kg)    Fetal Status: Fetal Heart Rate (bpm): 136   Movement: Present     General:  Alert, oriented and cooperative. Patient is in no acute distress.  Skin: Skin is warm and dry. No rash noted.   Cardiovascular: Normal heart rate noted  Respiratory: Normal respiratory effort, no problems with respiration noted  Abdomen: Soft, gravid, appropriate for gestational age. Pain/Pressure: Absent     Pelvic: Vag. Bleeding: None Vag D/C Character: Thin   Cervical exam deferred        Extremities: Normal range of motion.  Edema: None  Mental Status: Normal mood and affect. Normal behavior. Normal judgment and thought content.  Bilateral calf exam:  Normal, no edema, Negative Homans  Urinalysis: Urine Protein: Negative Urine Glucose: Negative  Assessment and Plan:  Pregnancy: G1P0 at [redacted]w[redacted]d  1. Supervision of normal pregnancy, unspecified trimester      Discussed calf stretches bid.  If not improved in 2-3 days, may consider doppler study.      Itching resolved.  Doubt cholestasis  2. Encounter for supervision of normal first pregnancy in second trimester      Preterm labor symptoms and general obstetric precautions including but not limited to vaginal bleeding, contractions, leaking of fluid and fetal movement were reviewed in detail with the patient. Please refer to After Visit  Summary for other counseling recommendations.  Return in about 2 weeks (around 06/18/2015) for [employer].   Seabron Spates, CNM

## 2015-06-04 NOTE — Patient Instructions (Signed)
Third Trimester of Pregnancy The third trimester is from week 29 through week 42, months 7 through 9. The third trimester is a time when the fetus is growing rapidly. At the end of the ninth month, the fetus is about 20 inches in length and weighs 6-10 pounds.  BODY CHANGES Your body goes through many changes during pregnancy. The changes vary from woman to woman.   Your weight will continue to increase. You can expect to gain 25-35 pounds (11-16 kg) by the end of the pregnancy.  You may begin to get stretch marks on your hips, abdomen, and breasts.  You may urinate more often because the fetus is moving lower into your pelvis and pressing on your bladder.  You may develop or continue to have heartburn as a result of your pregnancy.  You may develop constipation because certain hormones are causing the muscles that push waste through your intestines to slow down.  You may develop hemorrhoids or swollen, bulging veins (varicose veins).  You may have pelvic pain because of the weight gain and pregnancy hormones relaxing your joints between the bones in your pelvis. Backaches may result from overexertion of the muscles supporting your posture.  You may have changes in your hair. These can include thickening of your hair, rapid growth, and changes in texture. Some women also have hair loss during or after pregnancy, or hair that feels dry or thin. Your hair will most likely return to normal after your baby is born.  Your breasts will continue to grow and be tender. A yellow discharge may leak from your breasts called colostrum.  Your belly button may stick out.  You may feel short of breath because of your expanding uterus.  You may notice the fetus "dropping," or moving lower in your abdomen.  You may have a bloody mucus discharge. This usually occurs a few days to a week before labor begins.  Your cervix becomes thin and soft (effaced) near your due date. WHAT TO EXPECT AT YOUR PRENATAL  EXAMS  You will have prenatal exams every 2 weeks until week 36. Then, you will have weekly prenatal exams. During a routine prenatal visit:  You will be weighed to make sure you and the fetus are growing normally.  Your blood pressure is taken.  Your abdomen will be measured to track your baby's growth.  The fetal heartbeat will be listened to.  Any test results from the previous visit will be discussed.  You may have a cervical check near your due date to see if you have effaced. At around 36 weeks, your caregiver will check your cervix. At the same time, your caregiver will also perform a test on the secretions of the vaginal tissue. This test is to determine if a type of bacteria, Group B streptococcus, is present. Your caregiver will explain this further. Your caregiver may ask you:  What your birth plan is.  How you are feeling.  If you are feeling the baby move.  If you have had any abnormal symptoms, such as leaking fluid, bleeding, severe headaches, or abdominal cramping.  If you have any questions. Other tests or screenings that may be performed during your third trimester include:  Blood tests that check for low iron levels (anemia).  Fetal testing to check the health, activity level, and growth of the fetus. Testing is done if you have certain medical conditions or if there are problems during the pregnancy. FALSE LABOR You may feel small, irregular contractions that   eventually go away. These are called Braxton Hicks contractions, or false labor. Contractions may last for hours, days, or even weeks before true labor sets in. If contractions come at regular intervals, intensify, or become painful, it is best to be seen by your caregiver.  SIGNS OF LABOR   Menstrual-like cramps.  Contractions that are 5 minutes apart or less.  Contractions that start on the top of the uterus and spread down to the lower abdomen and back.  A sense of increased pelvic pressure or back  pain.  A watery or bloody mucus discharge that comes from the vagina. If you have any of these signs before the 37th week of pregnancy, call your caregiver right away. You need to go to the hospital to get checked immediately. HOME CARE INSTRUCTIONS   Avoid all smoking, herbs, alcohol, and unprescribed drugs. These chemicals affect the formation and growth of the baby.  Follow your caregiver's instructions regarding medicine use. There are medicines that are either safe or unsafe to take during pregnancy.  Exercise only as directed by your caregiver. Experiencing uterine cramps is a good sign to stop exercising.  Continue to eat regular, healthy meals.  Wear a good support bra for breast tenderness.  Do not use hot tubs, steam rooms, or saunas.  Wear your seat belt at all times when driving.  Avoid raw meat, uncooked cheese, cat litter boxes, and soil used by cats. These carry germs that can cause birth defects in the baby.  Take your prenatal vitamins.  Try taking a stool softener (if your caregiver approves) if you develop constipation. Eat more high-fiber foods, such as fresh vegetables or fruit and whole grains. Drink plenty of fluids to keep your urine clear or pale yellow.  Take warm sitz baths to soothe any pain or discomfort caused by hemorrhoids. Use hemorrhoid cream if your caregiver approves.  If you develop varicose veins, wear support hose. Elevate your feet for 15 minutes, 3-4 times a day. Limit salt in your diet.  Avoid heavy lifting, wear low heal shoes, and practice good posture.  Rest a lot with your legs elevated if you have leg cramps or low back pain.  Visit your dentist if you have not gone during your pregnancy. Use a soft toothbrush to brush your teeth and be gentle when you floss.  A sexual relationship may be continued unless your caregiver directs you otherwise.  Do not travel far distances unless it is absolutely necessary and only with the approval  of your caregiver.  Take prenatal classes to understand, practice, and ask questions about the labor and delivery.  Make a trial run to the hospital.  Pack your hospital bag.  Prepare the baby's nursery.  Continue to go to all your prenatal visits as directed by your caregiver. SEEK MEDICAL CARE IF:  You are unsure if you are in labor or if your water has broken.  You have dizziness.  You have mild pelvic cramps, pelvic pressure, or nagging pain in your abdominal area.  You have persistent nausea, vomiting, or diarrhea.  You have a bad smelling vaginal discharge.  You have pain with urination. SEEK IMMEDIATE MEDICAL CARE IF:   You have a fever.  You are leaking fluid from your vagina.  You have spotting or bleeding from your vagina.  You have severe abdominal cramping or pain.  You have rapid weight loss or gain.  You have shortness of breath with chest pain.  You notice sudden or extreme swelling   of your face, hands, ankles, feet, or legs.  You have not felt your baby move in over an hour.  You have severe headaches that do not go away with medicine.  You have vision changes. Document Released: 08/12/2001 Document Revised: 08/23/2013 Document Reviewed: 10/19/2012 ExitCare Patient Information 2015 [employer]. This information is not intended to replace advice given to you by your health care provider. Make sure you discuss any questions you have with your health care provider.  

## 2015-06-04 NOTE — Progress Notes (Signed)
Increase in leg cramps @ night

## 2015-06-18 ENCOUNTER — Ambulatory Visit (INDEPENDENT_AMBULATORY_CARE_PROVIDER_SITE_OTHER): Payer: BLUE CROSS/BLUE SHIELD | Admitting: Certified Nurse Midwife

## 2015-06-18 VITALS — BP 116/79 | HR 115 | Wt 122.0 lb

## 2015-06-18 DIAGNOSIS — Z3403 Encounter for supervision of normal first pregnancy, third trimester: Secondary | ICD-10-CM

## 2015-06-18 NOTE — Progress Notes (Signed)
Subjective:  Melissa Mcclain is a 29 y.o. G1P0 at [redacted]w[redacted]d being seen today for ongoing prenatal care.  Patient reports occasional contractions.  Contractions: Not present.  Vag. Bleeding: None. Movement: Present. Denies leaking of fluid.   The following portions of the patient's history were reviewed and updated as appropriate: allergies, current medications, past family history, past medical history, past social history, past surgical history and problem list. Problem list updated.  Objective:   Filed Vitals:   06/18/15 0938  BP: 116/79  Pulse: 115  Weight: 122 lb (55.339 kg)    Fetal Status: Fetal Heart Rate (bpm): 148 Fundal Height: 30 cm Movement: Present     General:  Alert, oriented and cooperative. Patient is in no acute distress.  Skin: Skin is warm and dry. No rash noted.   Cardiovascular: Normal heart rate noted  Respiratory: Normal respiratory effort, no problems with respiration noted  Abdomen: Soft, gravid, appropriate for gestational age. Pain/Pressure: Absent     Pelvic: Vag. Bleeding: None Vag D/C Character: Thin   Cervical exam deferred        Extremities: Normal range of motion.  Edema: None  Mental Status: Normal mood and affect. Normal behavior. Normal judgment and thought content.   Urinalysis: Urine Protein: Negative Urine Glucose: Negative  Assessment and Plan:  Pregnancy: G1P0 at [redacted]w[redacted]d  There are no diagnoses linked to this encounter. Preterm labor symptoms and general obstetric precautions including but not limited to vaginal bleeding, contractions, leaking of fluid and fetal movement were reviewed in detail with the patient. Please refer to After Visit Summary for other counseling recommendations.  Return in about 2 weeks (around 07/02/2015).   Larey Days, CNM

## 2015-06-18 NOTE — Patient Instructions (Signed)

## 2015-07-02 ENCOUNTER — Ambulatory Visit (INDEPENDENT_AMBULATORY_CARE_PROVIDER_SITE_OTHER): Payer: BLUE CROSS/BLUE SHIELD | Admitting: Family

## 2015-07-02 VITALS — BP 124/80 | HR 99 | Wt 124.0 lb

## 2015-07-02 DIAGNOSIS — Z3402 Encounter for supervision of normal first pregnancy, second trimester: Secondary | ICD-10-CM

## 2015-07-02 DIAGNOSIS — Z3493 Encounter for supervision of normal pregnancy, unspecified, third trimester: Secondary | ICD-10-CM

## 2015-07-02 DIAGNOSIS — Z3403 Encounter for supervision of normal first pregnancy, third trimester: Secondary | ICD-10-CM

## 2015-07-02 NOTE — Progress Notes (Signed)
Subjective:  Melissa Mcclain is a 29 y.o. G1P0 at [redacted]w[redacted]d being seen today for ongoing prenatal care.  Patient reports pain on upper abdomen around top of uterus usually at end of day.  Does not feel it's associated with eating.  Reports prior itching has resolved, felt it was related to warm baths.  Contractions: Not present.  Vag. Bleeding: None. Movement: Present. Denies leaking of fluid.   The following portions of the patient's history were reviewed and updated as appropriate: allergies, current medications, past family history, past medical history, past social history, past surgical history and problem list. Problem list updated.  Objective:   Filed Vitals:   07/02/15 0926  BP: 124/80  Pulse: 99  Weight: 124 lb (56.246 kg)    Fetal Status: Fetal Heart Rate (bpm): 136 (Simultaneous filing. User may not have seen previous data.) Fundal Height: 34 cm Movement: Present     General:  Alert, oriented and cooperative. Patient is in no acute distress.  Skin: Skin is warm and dry. No rash noted.   Cardiovascular: Normal heart rate noted  Respiratory: Normal respiratory effort, no problems with respiration noted  Abdomen: Soft, gravid, appropriate for gestational age. Pain/Pressure: Absent; tender with palpation at upper abdomen, no abnormal masses   Pelvic: Vag. Bleeding: None Vag D/C Character: Thin   Cervical exam deferred        Extremities: Normal range of motion.  Edema: None  Mental Status: Normal mood and affect. Normal behavior. Normal judgment and thought content.   Urinalysis:     Urine results not available at discharge.    Assessment and Plan:  Pregnancy: G1P0 at [redacted]w[redacted]d  1. Supervision of Normal Pregnancy - Discussed GBS at next visit  2.  Abdominal Pain in Pregnancy - Will log when pain is occuring, triggers; will try antacid.  May obtain CMP if pain continues; likely muscle pain related to gravid uterus   Preterm labor symptoms and general obstetric precautions including  but not limited to vaginal bleeding, contractions, leaking of fluid and fetal movement were reviewed in detail with the patient. Please refer to After Visit Summary for other counseling recommendations.  Return in about 2 weeks (around 07/16/2015).   Venia Carbon Michiel Cowboy, CNM

## 2015-07-03 ENCOUNTER — Encounter (INDEPENDENT_AMBULATORY_CARE_PROVIDER_SITE_OTHER): Payer: BLUE CROSS/BLUE SHIELD | Admitting: *Deleted

## 2015-07-03 DIAGNOSIS — Z3403 Encounter for supervision of normal first pregnancy, third trimester: Secondary | ICD-10-CM

## 2015-07-16 ENCOUNTER — Ambulatory Visit (INDEPENDENT_AMBULATORY_CARE_PROVIDER_SITE_OTHER): Payer: BLUE CROSS/BLUE SHIELD | Admitting: Advanced Practice Midwife

## 2015-07-16 VITALS — BP 111/77 | HR 156 | Wt 127.0 lb

## 2015-07-16 DIAGNOSIS — Z3403 Encounter for supervision of normal first pregnancy, third trimester: Secondary | ICD-10-CM

## 2015-07-16 NOTE — Progress Notes (Signed)
Subjective:  Jazyah Wheaton is a 29 y.o. G1P0 at [redacted]w[redacted]d being seen today for ongoing prenatal care.  Patient reports occasional contractions.  Contractions: Irregular.  Vag. Bleeding: None. Movement: Present. Denies leaking of fluid.   The following portions of the patient's history were reviewed and updated as appropriate: allergies, current medications, past family history, past medical history, past social history, past surgical history and problem list. Problem list updated.  Objective:   Filed Vitals:   07/16/15 0939  BP: 111/77  Pulse: 156  Weight: 127 lb (57.607 kg)    Fetal Status:     Movement: Present     General:  Alert, oriented and cooperative. Patient is in no acute distress.  Skin: Skin is warm and dry. No rash noted.   Cardiovascular: Normal heart rate noted  Respiratory: Normal respiratory effort, no problems with respiration noted  Abdomen: Soft, gravid, appropriate for gestational age. Pain/Pressure: Present     Pelvic: Vag. Bleeding: None Vag D/C Character: Thin   Cervical exam performed      Closed and long  Extremities: Normal range of motion.  Edema: Trace  Mental Status: Normal mood and affect. Normal behavior. Normal judgment and thought content.   Urinalysis:      Assessment and Plan:  Pregnancy: G1P0 at [redacted]w[redacted]d  1. Encounter for supervision of normal first pregnancy in third trimester   Term labor symptoms and general obstetric precautions including but not limited to vaginal bleeding, contractions, leaking of fluid and fetal movement were reviewed in detail with the patient. Please refer to After Visit Summary for other counseling recommendations.  Return in about 1 week (around 07/23/2015).   Manya Silvas, CNM

## 2015-07-16 NOTE — Patient Instructions (Signed)
[facility]   Braxton Hicks Contractions Contractions of the uterus can occur throughout pregnancy. Contractions are not always a sign that you are in labor.  WHAT ARE BRAXTON HICKS CONTRACTIONS?  Contractions that occur before labor are called Braxton Hicks contractions, or false labor. Toward the end of pregnancy (32-34 weeks), these contractions can develop more often and may become more forceful. This is not true labor because these contractions do not result in opening (dilatation) and thinning of the cervix. They are sometimes difficult to tell apart from true labor because these contractions can be forceful and people have different pain tolerances. You should not feel embarrassed if you go to the hospital with false labor. Sometimes, the only way to tell if you are in true labor is for your health care provider to look for changes in the cervix. If there are no prenatal problems or other health problems associated with the pregnancy, it is completely safe to be sent home with false labor and await the onset of true labor. HOW CAN YOU TELL THE DIFFERENCE BETWEEN TRUE AND FALSE LABOR? False Labor  The contractions of false labor are usually shorter and not as hard as those of true labor.   The contractions are usually irregular.   The contractions are often felt in the front of the lower abdomen and in the groin.   The contractions may go away when you walk around or change positions while lying down.   The contractions get weaker and are shorter lasting as time goes on.   The contractions do not usually become progressively stronger, regular, and closer together as with true labor.  True Labor  Contractions in true labor last 30-70 seconds, become very regular, usually become more intense, and increase in frequency.   The contractions do not go away with walking.   The discomfort is usually felt in the top of the uterus and spreads to the lower abdomen  and low back.   True labor can be determined by your health care provider with an exam. This will show that the cervix is dilating and getting thinner.  WHAT TO REMEMBER  Keep up with your usual exercises and follow other instructions given by your health care provider.   Take medicines as directed by your health care provider.   Keep your regular prenatal appointments.   Eat and drink lightly if you think you are going into labor.   If Braxton Hicks contractions are making you uncomfortable:   Change your position from lying down or resting to walking, or from walking to resting.   Sit and rest in a tub of warm water.   Drink 2-3 glasses of water. Dehydration may cause these contractions.   Do slow and deep breathing several times an hour.  WHEN SHOULD I SEEK IMMEDIATE MEDICAL CARE? Seek immediate medical care if:  Your contractions become stronger, more regular, and closer together.   You have fluid leaking or gushing from your vagina.   You have a fever.   You pass blood-tinged mucus.   You have vaginal bleeding.   You have continuous abdominal pain.   You have low back pain that you never had before.   You feel your baby's head pushing down and causing pelvic pressure.   Your baby is not moving as much as it used to.    This information is not intended to replace advice given to you by your health care provider. Make sure you discuss any  questions you have with your health care provider.   Document Released: 08/18/2005 Document Revised: 08/23/2013 Document Reviewed: 05/30/2013 Elsevier Interactive Patient Education [employer].

## 2015-07-16 NOTE — Progress Notes (Signed)
36wk labs today Pt states she feels SOB today and having a hard time breathing.

## 2015-07-23 ENCOUNTER — Encounter: Payer: BLUE CROSS/BLUE SHIELD | Admitting: Advanced Practice Midwife

## 2015-07-23 ENCOUNTER — Ambulatory Visit (INDEPENDENT_AMBULATORY_CARE_PROVIDER_SITE_OTHER): Payer: BLUE CROSS/BLUE SHIELD | Admitting: Obstetrics & Gynecology

## 2015-07-23 VITALS — BP 124/79 | HR 102 | Wt 131.0 lb

## 2015-07-23 DIAGNOSIS — Z113 Encounter for screening for infections with a predominantly sexual mode of transmission: Secondary | ICD-10-CM

## 2015-07-23 DIAGNOSIS — Z3490 Encounter for supervision of normal pregnancy, unspecified, unspecified trimester: Secondary | ICD-10-CM

## 2015-07-23 DIAGNOSIS — Z3403 Encounter for supervision of normal first pregnancy, third trimester: Secondary | ICD-10-CM

## 2015-07-23 LAB — OB RESULTS CONSOLE GBS: GBS: NEGATIVE

## 2015-07-23 LAB — OB RESULTS CONSOLE GC/CHLAMYDIA: Gonorrhea: NEGATIVE

## 2015-07-23 NOTE — Progress Notes (Signed)
Subjective:  Melissa Mcclain is a 29 y.o. G1P0 at [redacted]w[redacted]d being seen today for ongoing prenatal care.  She is currently monitored for the following issues for this low-risk pregnancy: Patient Active Problem List   Diagnosis Date Noted  . Choroid plexus cyst of fetus   . Encounter for supervision of normal first pregnancy in second trimester   . Scabies 03/12/2015  . Cerumen impaction 03/12/2015  . 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 Supervision of normal first pregnancy 01/18/2015  . IDA (iron deficiency anemia) 08/16/2014  . Seasonal allergies 08/16/2014  . Asthma, cough variant 08/16/2014  . PMDD (premenstrual dysphoric disorder) 08/16/2014  . Anxiety and depression 08/16/2014  . Gastroesophageal reflux disease without esophagitis 08/16/2014  . Hyperlipidemia 08/16/2014   Patient reports no complaints.  Contractions: Not present. Vag. Bleeding: None.  Movement: Present. Denies leaking of fluid.   The following portions of the patient's history were reviewed and updated as appropriate: allergies, current medications, past family history, past medical history, past social history, past surgical history and problem list. Problem list updated.  Objective:   Filed Vitals:   07/23/15 1522  BP: 124/79  Pulse: 102  Weight: 131 lb (59.421 kg)    Fetal Status: Fetal Heart Rate (bpm): 138 Fundal Height: 36 cm Movement: Present  Presentation: Vertex  General:  Alert, oriented and cooperative. Patient is in no acute distress.  Skin: Skin is warm and dry. No rash noted.   Cardiovascular: Normal heart rate noted  Respiratory: Normal respiratory effort, no problems with respiration noted  Abdomen: Soft, gravid, appropriate for gestational age. Pain/Pressure: Absent     Pelvic: Vag. Bleeding: None Vag D/C Character: Thin   Cervical exam performed        Extremities: Normal range of motion.  Edema: Trace  Mental Status: Normal mood and affect. Normal behavior. Normal judgment and thought  content.   Urinalysis: Urine Protein: Negative Urine Glucose: Negative  Assessment and Plan:  Pregnancy: G1P0 at [redacted]w[redacted]d  1. Normal pregnancy, unspecified trimester - Vertex by [place] - Culture, beta strep (group b only) - GC/Chlamydia probe amp (Agency)not at [facility]   Term labor symptoms and general obstetric precautions including but not limited to vaginal bleeding, contractions, leaking of fluid and fetal movement were reviewed in detail with the patient. Please refer to After Visit Summary for other counseling recommendations.  Return in about 1 week (around 07/30/2015).   Guss Bunde, MD

## 2015-07-25 LAB — GC/CHLAMYDIA PROBE AMP (~~LOC~~) NOT AT ARMC
Chlamydia: NEGATIVE
Neisseria Gonorrhea: NEGATIVE

## 2015-07-25 LAB — CULTURE, BETA STREP (GROUP B ONLY)

## 2015-08-03 ENCOUNTER — Ambulatory Visit (INDEPENDENT_AMBULATORY_CARE_PROVIDER_SITE_OTHER): Payer: BLUE CROSS/BLUE SHIELD | Admitting: Family

## 2015-08-03 VITALS — BP 125/82 | HR 108 | Wt 130.0 lb

## 2015-08-03 DIAGNOSIS — Z3403 Encounter for supervision of normal first pregnancy, third trimester: Secondary | ICD-10-CM

## 2015-08-03 DIAGNOSIS — Z3493 Encounter for supervision of normal pregnancy, unspecified, third trimester: Secondary | ICD-10-CM

## 2015-08-03 NOTE — Progress Notes (Signed)
Subjective:  Ruthel Wiesner is a 29 y.o. G1P0 at [redacted]w[redacted]d being seen today for ongoing prenatal care.  She is currently monitored for the following issues for this low-risk pregnancy and has IDA (iron deficiency anemia);  Choroid plexus cyst of fetus; and Encounter for supervision of normal first pregnancy in second trimester on her problem list.  Patient reports backache.  Contractions: Irritability. Vag. Bleeding: None.  Movement: Present. Denies leaking of fluid.   The following portions of the patient's history were reviewed and updated as appropriate: allergies, current medications, past family history, past medical history, past social history, past surgical history and problem list. Problem list updated.  Objective:   Filed Vitals:   08/03/15 0931  BP: 125/82  Pulse: 108  Weight: 130 lb (58.968 kg)    Fetal Status:     Movement: Present     General:  Alert, oriented and cooperative. Patient is in no acute distress.  Skin: Skin is warm and dry. No rash noted.   Cardiovascular: Normal heart rate noted  Respiratory: Normal respiratory effort, no problems with respiration noted  Abdomen: Soft, gravid, appropriate for gestational age. Pain/Pressure: Present     Pelvic: Vag. Bleeding: None Vag D/C Character: Thin   FT/50  Extremities: Normal range of motion.  Edema: Trace  Mental Status: Normal mood and affect. Normal behavior. Normal judgment and thought content.   Urinalysis: Urine Protein: Negative Urine Glucose: Negative  Assessment and Plan:  Pregnancy: G1P0 at [redacted]w[redacted]d  Supervision of Normal Pregancy  GBS and GC/Ct collected today  Term labor symptoms and general obstetric precautions including but not limited to vaginal bleeding, contractions, leaking of fluid and fetal movement were reviewed in detail with the patient. Please refer to After Visit Summary for other counseling recommendations.  Return in about 1 week (around 08/10/2015).   Venia Carbon Michiel Cowboy, CNM

## 2015-08-03 NOTE — Progress Notes (Signed)
Wants cervix checked today

## 2015-08-13 ENCOUNTER — Ambulatory Visit (INDEPENDENT_AMBULATORY_CARE_PROVIDER_SITE_OTHER): Payer: BLUE CROSS/BLUE SHIELD | Admitting: Advanced Practice Midwife

## 2015-08-13 VITALS — BP 116/79 | HR 97 | Wt 131.0 lb

## 2015-08-13 DIAGNOSIS — Z3403 Encounter for supervision of normal first pregnancy, third trimester: Secondary | ICD-10-CM

## 2015-08-13 NOTE — Progress Notes (Signed)
Subjective:  Melissa Mcclain is a 29 y.o. G1P0 at [redacted]w[redacted]d being seen today for ongoing prenatal care.  She is currently monitored for the following issues for this low-risk pregnancy and has IDA (iron deficiency anemia); Seasonal allergies; Asthma, cough variant; PMDD (premenstrual dysphoric disorder); Anxiety and depression; Gastroesophageal reflux disease without esophagitis; Hyperlipidemia; Supervision of normal first pregnancy; Cystic fibrosis carrier; Scabies; Cerumen impaction; Choroid plexus cyst of fetus; and Encounter for supervision of normal first pregnancy in second trimester on her problem list.  Patient reports occasional contractions.  Contractions: Irregular. Vag. Bleeding: None.  Movement: Present. Denies leaking of fluid.   The following portions of the patient's history were reviewed and updated as appropriate: allergies, current medications, past family history, past medical history, past social history, past surgical history and problem list. Problem list updated.  Objective:   Filed Vitals:   08/13/15 1052  BP: 116/79  Pulse: 97  Weight: 131 lb (59.421 kg)    Fetal Status: Fetal Heart Rate (bpm): 150   Movement: Present     General:  Alert, oriented and cooperative. Patient is in no acute distress.  Skin: Skin is warm and dry. No rash noted.   Cardiovascular: Normal heart rate noted  Respiratory: Normal respiratory effort, no problems with respiration noted  Abdomen: Soft, gravid, appropriate for gestational age. Pain/Pressure: Present     Pelvic: Vag. Bleeding: None Vag D/C Character: Thin   Cervical exam performed      0.5/70/-2  Extremities: Normal range of motion.  Edema: None  Mental Status: Normal mood and affect. Normal behavior. Normal judgment and thought content.   Urinalysis: Urine Protein: Negative Urine Glucose: Negative  Assessment and Plan:  Pregnancy: G1P0 at [redacted]w[redacted]d  There are no diagnoses linked to this encounter. Term labor symptoms and general  obstetric precautions including but not limited to vaginal bleeding, contractions, leaking of fluid and fetal movement were reviewed in detail with the patient. Please refer to After Visit Summary for other counseling recommendations.  Return in about 4 days (around 08/17/2015) for NST/ROB.   Manya Silvas, CNM

## 2015-08-13 NOTE — Patient Instructions (Signed)
Braxton Hicks Contractions °Contractions of the uterus can occur throughout pregnancy. Contractions are not always a sign that you are in labor.  °WHAT ARE BRAXTON HICKS CONTRACTIONS?  °Contractions that occur before labor are called Braxton Hicks contractions, or false labor. Toward the end of pregnancy (32-34 weeks), these contractions can develop more often and may become more forceful. This is not true labor because these contractions do not result in opening (dilatation) and thinning of the cervix. They are sometimes difficult to tell apart from true labor because these contractions can be forceful and people have different pain tolerances. You should not feel embarrassed if you go to the hospital with false labor. Sometimes, the only way to tell if you are in true labor is for your health care provider to look for changes in the cervix. °If there are no prenatal problems or other health problems associated with the pregnancy, it is completely safe to be sent home with false labor and await the onset of true labor. °HOW CAN YOU TELL THE DIFFERENCE BETWEEN TRUE AND FALSE LABOR? °False Labor °· The contractions of false labor are usually shorter and not as hard as those of true labor.   °· The contractions are usually irregular.   °· The contractions are often felt in the front of the lower abdomen and in the groin.   °· The contractions may go away when you walk around or change positions while lying down.   °· The contractions get weaker and are shorter lasting as time goes on.   °· The contractions do not usually become progressively stronger, regular, and closer together as with true labor.   °True Labor °· Contractions in true labor last 30-70 seconds, become very regular, usually become more intense, and increase in frequency.   °· The contractions do not go away with walking.   °· The discomfort is usually felt in the top of the uterus and spreads to the lower abdomen and low back.   °· True labor can be  determined by your health care provider with an exam. This will show that the cervix is dilating and getting thinner.   °WHAT TO REMEMBER °· Keep up with your usual exercises and follow other instructions given by your health care provider.   °· Take medicines as directed by your health care provider.   °· Keep your regular prenatal appointments.   °· Eat and drink lightly if you think you are going into labor.   °· If Braxton Hicks contractions are making you uncomfortable:   °¨ Change your position from lying down or resting to walking, or from walking to resting.   °¨ Sit and rest in a tub of warm water.   °¨ Drink 2-3 glasses of water. Dehydration may cause these contractions.   °¨ Do slow and deep breathing several times an hour.   °WHEN SHOULD I SEEK IMMEDIATE MEDICAL CARE? °Seek immediate medical care if: °· Your contractions become stronger, more regular, and closer together.   °· You have fluid leaking or gushing from your vagina.   °· You have a fever.   °· You pass blood-tinged mucus.   °· You have vaginal bleeding.   °· You have continuous abdominal pain.   °· You have low back pain that you never had before.   °· You feel your baby's head pushing down and causing pelvic pressure.   °· Your baby is not moving as much as it used to.   °  °This information is not intended to replace advice given to you by your health care provider. Make sure you discuss any questions you have with your health care   provider. °  °Document Released: 08/18/2005 Document Revised: 08/23/2013 Document Reviewed: 05/30/2013 °Elsevier Interactive Patient Education ©2016 Elsevier Inc. ° °

## 2015-08-17 ENCOUNTER — Ambulatory Visit: Payer: BLUE CROSS/BLUE SHIELD | Admitting: *Deleted

## 2015-08-17 VITALS — BP 114/70

## 2015-08-17 DIAGNOSIS — Z3403 Encounter for supervision of normal first pregnancy, third trimester: Secondary | ICD-10-CM

## 2015-08-19 ENCOUNTER — Encounter (HOSPITAL_COMMUNITY): Payer: Self-pay

## 2015-08-19 ENCOUNTER — Inpatient Hospital Stay (HOSPITAL_COMMUNITY)
Admission: AD | Admit: 2015-08-19 | Discharge: 2015-08-22 | DRG: 775 | Disposition: A | Discharge status: Home / Self Care | Payer: BLUE CROSS/BLUE SHIELD | Source: Ambulatory Visit | Attending: Obstetrics & Gynecology | Admitting: Obstetrics & Gynecology

## 2015-08-19 DIAGNOSIS — J45991 Cough variant asthma: Secondary | ICD-10-CM | POA: Diagnosis present

## 2015-08-19 DIAGNOSIS — O480 Post-term pregnancy: Secondary | ICD-10-CM | POA: Diagnosis present

## 2015-08-19 DIAGNOSIS — Z3403 Encounter for supervision of normal first pregnancy, third trimester: Secondary | ICD-10-CM | POA: Diagnosis present

## 2015-08-19 DIAGNOSIS — F419 Anxiety disorder, unspecified: Secondary | ICD-10-CM | POA: Diagnosis present

## 2015-08-19 DIAGNOSIS — Z3A40 40 weeks gestation of pregnancy: Secondary | ICD-10-CM | POA: Diagnosis not present

## 2015-08-19 DIAGNOSIS — IMO0001 Reserved for inherently not codable concepts without codable children: Secondary | ICD-10-CM

## 2015-08-19 DIAGNOSIS — O99344 Other mental disorders complicating childbirth: Secondary | ICD-10-CM | POA: Diagnosis present

## 2015-08-19 DIAGNOSIS — F32A Depression, unspecified: Secondary | ICD-10-CM | POA: Diagnosis present

## 2015-08-19 DIAGNOSIS — Z3400 Encounter for supervision of normal first pregnancy, unspecified trimester: Secondary | ICD-10-CM

## 2015-08-19 DIAGNOSIS — O09299 Supervision of pregnancy with other poor reproductive or obstetric history, unspecified trimester: Secondary | ICD-10-CM

## 2015-08-19 DIAGNOSIS — F329 Major depressive disorder, single episode, unspecified: Secondary | ICD-10-CM | POA: Diagnosis present

## 2015-08-19 DIAGNOSIS — Z8759 Personal history of other complications of pregnancy, childbirth and the puerperium: Secondary | ICD-10-CM

## 2015-08-19 DIAGNOSIS — O9952 Diseases of the respiratory system complicating childbirth: Secondary | ICD-10-CM | POA: Diagnosis present

## 2015-08-19 DIAGNOSIS — Z141 Cystic fibrosis carrier: Secondary | ICD-10-CM

## 2015-08-19 HISTORY — DX: Unspecified asthma, uncomplicated: J45.909

## 2015-08-19 HISTORY — DX: Cardiac murmur, unspecified: R01.1

## 2015-08-19 LAB — CBC
HCT: 34.9 % — ABNORMAL LOW (ref 36.0–46.0)
Hemoglobin: 11.4 g/dL — ABNORMAL LOW (ref 12.0–15.0)
MCH: 30.8 pg (ref 26.0–34.0)
MCHC: 32.7 g/dL (ref 30.0–36.0)
MCV: 94.3 fL (ref 78.0–100.0)
Platelets: 172 10*3/uL (ref 150–400)
RBC: 3.7 MIL/uL — ABNORMAL LOW (ref 3.87–5.11)
RDW: 14.4 % (ref 11.5–15.5)
WBC: 13.2 10*3/uL — ABNORMAL HIGH (ref 4.0–10.5)

## 2015-08-19 MED ORDER — OXYCODONE-ACETAMINOPHEN 5-325 MG PO TABS: 2.0000 | ORAL_TABLET | ORAL | Status: DC | PRN

## 2015-08-19 MED ORDER — OXYCODONE-ACETAMINOPHEN 5-325 MG PO TABS: 1.0000 | ORAL_TABLET | ORAL | Status: DC | PRN

## 2015-08-19 MED ORDER — ACETAMINOPHEN 325 MG PO TABS: 650.0000 mg | ORAL_TABLET | ORAL | Status: DC | PRN

## 2015-08-19 MED ORDER — FENTANYL CITRATE (PF) 100 MCG/2ML IJ SOLN
50.0000 ug | INTRAMUSCULAR | Status: DC | PRN
  Administered 2015-08-20: 50 ug via INTRAVENOUS
  Filled 2015-08-19: qty 2

## 2015-08-19 MED ORDER — CITRIC ACID-SODIUM CITRATE 334-500 MG/5ML PO SOLN: 30.0000 mL | ORAL | Status: DC | PRN

## 2015-08-19 MED ORDER — ONDANSETRON HCL 4 MG/2ML IJ SOLN: 4.0000 mg | Freq: Four times a day (QID) | INTRAMUSCULAR | Status: DC | PRN

## 2015-08-19 MED ORDER — OXYTOCIN 40 UNITS IN LACTATED RINGERS INFUSION - SIMPLE MED
62.5000 mL/h | INTRAVENOUS | Status: DC
  Administered 2015-08-20: 62.5 mL/h via INTRAVENOUS

## 2015-08-19 MED ORDER — LACTATED RINGERS IV SOLN
INTRAVENOUS | Status: DC
  Administered 2015-08-19 – 2015-08-20 (×2): via INTRAVENOUS

## 2015-08-19 MED ORDER — LACTATED RINGERS IV SOLN: 500.0000 mL | INTRAVENOUS | Status: DC | PRN

## 2015-08-19 MED ORDER — LIDOCAINE HCL (PF) 1 % IJ SOLN
30.0000 mL | INTRAMUSCULAR | Status: DC | PRN
Start: 2015-08-19 — End: 2015-08-20
  Administered 2015-08-20: 30 mL via SUBCUTANEOUS
  Filled 2015-08-19: qty 30

## 2015-08-19 MED ORDER — OXYTOCIN BOLUS FROM INFUSION
500.0000 mL | INTRAVENOUS | Status: DC
  Administered 2015-08-20: 500 mL via INTRAVENOUS

## 2015-08-19 MED ORDER — PRENATAL MULTIVITAMIN CH: 1.0000 | ORAL_TABLET | Freq: Every day | ORAL | Status: DC

## 2015-08-19 MED ORDER — ACETAMINOPHEN 500 MG PO TABS: 1000.0000 mg | ORAL_TABLET | Freq: Four times a day (QID) | ORAL | Status: DC | PRN

## 2015-08-19 NOTE — Progress Notes (Signed)
Report called to D. Kellie Simmering CNM. Pt may walk and be rechecked or be discharged home.

## 2015-08-19 NOTE — H&P (Signed)
OBSTETRIC ADMISSION HISTORY AND PHYSICAL  Melissa Mcclain is a 29 y.o. female G1P0 with IUP at [redacted]w[redacted]d by LMP presenting for contractions, found to be in latent labor. She reports +FMs, No LOF, no VB, no blurry vision, headaches or peripheral edema, and RUQ pain.  She plans on breast feeding. She is unsure what she would like for birth control. She will be having a water birth.   Dating: By LMP --->  Estimated Date of Delivery: 08/14/15  Sono:  @[redacted]w[redacted]d , CWD, normal anatomy, cephalic presentation, posterior placenta above os, 287g, 47% EFW. Of note, US showed 71mm R choroid plexus cyst   Prenatal History/Complications: Cystic Fibrosis Carrier R choroid plexus cyst on [place]  Past Medical History: Past Medical History  Diagnosis Date  . Asthma   . Heart murmur     Past Surgical History: Past Surgical History  Procedure Laterality Date  . Tonsilectomy/adenoidectomy with myringotomy  1996  . Hernia repair  [phone]    Obstetrical History: OB History    Gravida Para Term Preterm AB TAB SAB Ectopic Multiple Living   1               Social History: Social History   Social History  . Marital Status: Married    Spouse Name: N/A  . Number of Children: N/A  . Years of Education: N/A   Social History Main Topics  . Smoking status: Never Smoker   . Smokeless tobacco: None  . Alcohol Use: 0.0 oz/week    0 Standard drinks or equivalent per week  . Drug Use: No  . Sexual Activity: Yes    Birth Control/ Protection: None   Other Topics Concern  . None   Social History Narrative    Family History: Family History  Problem Relation Age of Onset  . Hyperlipidemia Mother   . Alcohol abuse Father   . Depression Brother   . Alcohol abuse Maternal Aunt   . Alcohol abuse Maternal Uncle   . Alcohol abuse Paternal Aunt   . Hyperlipidemia Maternal Grandmother   . Alcohol abuse Maternal Grandfather   . Cancer Maternal Grandfather   . Cancer Paternal Grandmother   . Alcohol abuse  Paternal Aunt     Allergies: No Known Allergies  Prescriptions prior to admission  Medication Sig Dispense Refill Last Dose  . acetaminophen (TYLENOL) 500 MG tablet Take 1,000 mg by mouth every 6 (six) hours as needed for mild pain or headache.   08/19/2015 at 1100  . Prenatal Vit-Fe Fumarate-FA (PRENATAL MULTIVITAMIN) TABS tablet Take 1 tablet by mouth daily.   08/18/2015 at Unknown time     Review of Systems   All systems reviewed and negative except as stated in HPI  Blood pressure 142/82, pulse 104, temperature 97.6 F (36.4 C), temperature source Oral, resp. rate 20, last menstrual period 11/07/2014, SpO2 100 %. General appearance: alert, distracted and mild distress Lungs: clear to auscultation bilaterally Heart: regular rate and rhythm Abdomen: soft, non-tender; bowel sounds normal Extremities: Homans sign is negative, no sign of DVT Presentation: unsure Fetal monitoringBaseline: 135 bpm, Variability: Good {> 6 bpm) and Accelerations: Reactive Uterine activityFrequency: Every 1-2 minutes Dilation: 4 Effacement (%): 80 Station: -2 Exam by:: [employer] RN   Prenatal labs: ABO, Rh: O/POS/-- (05/19 1611) Antibody: NEG (05/19 1611) Rubella: !Error! IMMUNE RPR: NON REAC (09/20 1054)  HBsAg: NEGATIVE (05/19 1611)  HIV: NONREACTIVE (09/20 1054)  GBS: Negative (11/21 0000)  1 hr Glucola 110 Genetic screening  NIPS neg Anatomy  US: Normal with exception of R CPC  Prenatal Transfer Tool  Maternal Diabetes: No Genetic Screening: Normal Maternal Ultrasounds/Referrals: Abnormal:  Findings:   Isolated choroid plexus cyst Fetal Ultrasounds or other Referrals:  None Maternal Substance Abuse:  No Significant Maternal Medications:  None Significant Maternal Lab Results: None  No results found for this or any previous visit (from the past 24 hour(s)).  Patient Active Problem List   Diagnosis Date Noted  . Choroid plexus cyst of fetus   . Encounter for supervision of  normal first pregnancy in second trimester   . Scabies 03/12/2015  . Cerumen impaction 03/12/2015  . 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 Supervision of normal first pregnancy 01/18/2015  . IDA (iron deficiency anemia) 08/16/2014  . Seasonal allergies 08/16/2014  . Asthma, cough variant 08/16/2014  . PMDD (premenstrual dysphoric disorder) 08/16/2014  . Anxiety and depression 08/16/2014  . Gastroesophageal reflux disease without esophagitis 08/16/2014  . Hyperlipidemia 08/16/2014    Assessment: Melissa Mcclain is a 29 y.o. G1P0 at [redacted]w[redacted]d here for contractions and latent labor.   #Labor: Allow patient to labor on her own. If necessary, can add pitocin.  #Pain: Epidural available as needed, though patient plans on a water birth. #FWB: Excellent, cat 1 strip. Monitor FHTs q69min. Patient is allowed to get OOB and walk.  #ID:  GBS NEGATIVE #MOF: Breast #MOC: Unsure #Circ:  Inpatient  Stormy Card 08/19/2015, 9:58 PM

## 2015-08-19 NOTE — MAU Note (Signed)
Pt up to walk for a while; Birthing ball to MAU room as requested.

## 2015-08-19 NOTE — MAU Note (Signed)
Pt here with contractions since about 0930 this morning. Having some bloody show now and contractions every 3 min. Lost mucus plug earlier today.

## 2015-08-19 NOTE — Progress Notes (Signed)
Resident called with report. She will place orders for admission.

## 2015-08-20 ENCOUNTER — Encounter: Payer: BLUE CROSS/BLUE SHIELD | Admitting: Advanced Practice Midwife

## 2015-08-20 ENCOUNTER — Inpatient Hospital Stay (HOSPITAL_COMMUNITY): Payer: BLUE CROSS/BLUE SHIELD | Admitting: Anesthesiology

## 2015-08-20 ENCOUNTER — Encounter (HOSPITAL_COMMUNITY): Payer: Self-pay | Admitting: *Deleted

## 2015-08-20 DIAGNOSIS — Z3A40 40 weeks gestation of pregnancy: Secondary | ICD-10-CM

## 2015-08-20 LAB — NO BLOOD PRODUCTS

## 2015-08-20 LAB — RPR: RPR Ser Ql: NONREACTIVE

## 2015-08-20 MED ORDER — IBUPROFEN 600 MG PO TABS
600.0000 mg | ORAL_TABLET | Freq: Four times a day (QID) | ORAL | Status: DC
  Administered 2015-08-21 – 2015-08-22 (×7): 600 mg via ORAL
  Filled 2015-08-20 (×7): qty 1

## 2015-08-20 MED ORDER — FENTANYL 2.5 MCG/ML BUPIVACAINE 1/10 % EPIDURAL INFUSION (WH - ANES)
14.0000 mL/h | INTRAMUSCULAR | Status: DC | PRN
  Administered 2015-08-20 (×3): 14 mL/h via EPIDURAL
  Filled 2015-08-20: qty 125

## 2015-08-20 MED ORDER — ONDANSETRON HCL 4 MG/2ML IJ SOLN: 4.0000 mg | INTRAMUSCULAR | Status: DC | PRN

## 2015-08-20 MED ORDER — OXYTOCIN 40 UNITS IN LACTATED RINGERS INFUSION - SIMPLE MED
1.0000 m[IU]/min | INTRAVENOUS | Status: DC
  Administered 2015-08-20: 2 m[IU]/min via INTRAVENOUS
  Filled 2015-08-20: qty 1000

## 2015-08-20 MED ORDER — LIDOCAINE HCL (PF) 1 % IJ SOLN
INTRAMUSCULAR | Status: DC | PRN
  Administered 2015-08-20: 8 mL via EPIDURAL
  Administered 2015-08-20: 6 mL

## 2015-08-20 MED ORDER — OXYCODONE-ACETAMINOPHEN 5-325 MG PO TABS
1.0000 | ORAL_TABLET | ORAL | Status: DC | PRN
  Administered 2015-08-21 – 2015-08-22 (×3): 1 via ORAL
  Filled 2015-08-20 (×4): qty 1

## 2015-08-20 MED ORDER — OXYCODONE-ACETAMINOPHEN 5-325 MG PO TABS
2.0000 | ORAL_TABLET | ORAL | Status: DC | PRN
  Administered 2015-08-21: 2 via ORAL
  Filled 2015-08-20: qty 2

## 2015-08-20 MED ORDER — DIBUCAINE 1 % RE OINT: 1.0000 "application " | TOPICAL_OINTMENT | RECTAL | Status: DC | PRN

## 2015-08-20 MED ORDER — DIPHENHYDRAMINE HCL 50 MG/ML IJ SOLN: 12.5000 mg | INTRAMUSCULAR | Status: DC | PRN

## 2015-08-20 MED ORDER — WITCH HAZEL-GLYCERIN EX PADS: 1.0000 "application " | MEDICATED_PAD | CUTANEOUS | Status: DC | PRN

## 2015-08-20 MED ORDER — LANOLIN HYDROUS EX OINT: TOPICAL_OINTMENT | CUTANEOUS | Status: DC | PRN

## 2015-08-20 MED ORDER — PHENYLEPHRINE 40 MCG/ML (10ML) SYRINGE FOR IV PUSH (FOR BLOOD PRESSURE SUPPORT)
PREFILLED_SYRINGE | INTRAVENOUS | Status: AC
  Filled 2015-08-20: qty 20

## 2015-08-20 MED ORDER — ZOLPIDEM TARTRATE 5 MG PO TABS: 5.0000 mg | ORAL_TABLET | Freq: Every evening | ORAL | Status: DC | PRN

## 2015-08-20 MED ORDER — SIMETHICONE 80 MG PO CHEW: 80.0000 mg | CHEWABLE_TABLET | ORAL | Status: DC | PRN

## 2015-08-20 MED ORDER — PRENATAL MULTIVITAMIN CH
1.0000 | ORAL_TABLET | Freq: Every day | ORAL | Status: DC
  Administered 2015-08-21 – 2015-08-22 (×2): 1 via ORAL
  Filled 2015-08-20 (×2): qty 1

## 2015-08-20 MED ORDER — TERBUTALINE SULFATE 1 MG/ML IJ SOLN
0.2500 mg | Freq: Once | INTRAMUSCULAR | Status: DC | PRN
  Filled 2015-08-20: qty 1

## 2015-08-20 MED ORDER — DIPHENHYDRAMINE HCL 25 MG PO CAPS: 25.0000 mg | ORAL_CAPSULE | Freq: Four times a day (QID) | ORAL | Status: DC | PRN

## 2015-08-20 MED ORDER — SENNOSIDES-DOCUSATE SODIUM 8.6-50 MG PO TABS
2.0000 | ORAL_TABLET | ORAL | Status: DC
  Administered 2015-08-21 – 2015-08-22 (×2): 2 via ORAL
  Filled 2015-08-20 (×2): qty 2

## 2015-08-20 MED ORDER — BENZOCAINE-MENTHOL 20-0.5 % EX AERO
1.0000 "application " | INHALATION_SPRAY | CUTANEOUS | Status: DC | PRN
  Filled 2015-08-20: qty 56

## 2015-08-20 MED ORDER — EPHEDRINE 5 MG/ML INJ
10.0000 mg | INTRAVENOUS | Status: DC | PRN
  Filled 2015-08-20: qty 2

## 2015-08-20 MED ORDER — ONDANSETRON HCL 4 MG PO TABS: 4.0000 mg | ORAL_TABLET | ORAL | Status: DC | PRN

## 2015-08-20 MED ORDER — ACETAMINOPHEN 325 MG PO TABS: 650.0000 mg | ORAL_TABLET | ORAL | Status: DC | PRN

## 2015-08-20 MED ORDER — FENTANYL 2.5 MCG/ML BUPIVACAINE 1/10 % EPIDURAL INFUSION (WH - ANES)
INTRAMUSCULAR | Status: AC
  Filled 2015-08-20: qty 125

## 2015-08-20 MED ORDER — PHENYLEPHRINE 40 MCG/ML (10ML) SYRINGE FOR IV PUSH (FOR BLOOD PRESSURE SUPPORT)
80.0000 ug | PREFILLED_SYRINGE | INTRAVENOUS | Status: DC | PRN
  Filled 2015-08-20: qty 2

## 2015-08-20 MED ORDER — TETANUS-DIPHTH-ACELL PERTUSSIS 5-2.5-18.5 LF-MCG/0.5 IM SUSP
0.5000 mL | Freq: Once | INTRAMUSCULAR | Status: DC
Start: 2015-08-21 — End: 2015-08-22

## 2015-08-20 NOTE — Anesthesia Preprocedure Evaluation (Signed)
Anesthesia Evaluation  Patient identified by MRN, date of birth, ID band Patient awake    Reviewed: Allergy & Precautions, H&P , NPO status , Patient's Chart, lab work & pertinent test results  Airway Mallampati: I  TM Distance: >3 FB Neck ROM: full    Dental no notable dental hx.    Pulmonary    Pulmonary exam normal        Cardiovascular negative cardio ROS Normal cardiovascular exam     Neuro/Psych negative neurological ROS     GI/Hepatic Neg liver ROS,   Endo/Other  negative endocrine ROS  Renal/GU negative Renal ROS     Musculoskeletal   Abdominal Normal abdominal exam  (+)   Peds  Hematology negative hematology ROS (+)   Anesthesia Other Findings   Reproductive/Obstetrics (+) Pregnancy                             Anesthesia Physical Anesthesia Plan  ASA: II  Anesthesia Plan: Epidural   Post-op Pain Management:    Induction:   Airway Management Planned:   Additional Equipment:   Intra-op Plan:   Post-operative Plan:   Informed Consent: I have reviewed the patients History and Physical, chart, labs and discussed the procedure including the risks, benefits and alternatives for the proposed anesthesia with the patient or authorized representative who has indicated his/her understanding and acceptance.     Plan Discussed with:   Anesthesia Plan Comments:         Anesthesia Quick Evaluation

## 2015-08-20 NOTE — Progress Notes (Signed)
Labor Progress Note Melissa Mcclain is a 29 y.o. G1P0 at [redacted]w[redacted]d presented for SOL  S: Patient pushing   O:  BP 112/84 mmHg  Pulse 95  Temp(Src) 98 F (36.7 C) (Oral)  Resp 18  Ht 5\' 2"  (1.575 m)  Wt 131 lb (59.421 kg)  BMI 23.95 kg/m2  SpO2 100%  LMP 11/07/2014 EFM: 125 bpm/moderate variability /acclerations present   CVE: Dilation: 10 Dilation Complete Date: 08/20/15 Dilation Complete Time: 1543 Effacement (%): 100 Cervical Position: Anterior Station: +2 Presentation: Vertex Exam by:: Epifanio Lesches, RN   A&P: 29 y.o. G1P0 [redacted]w[redacted]d presented for SOL. Pitocin @ 11:15 AM. AROM @ 14:45. #Labor: Continue pushing  #Pain: Epidural  #FWB:Category 1 #GBS: negative  Melissa Witz Criss Rosales, MD 4:20 PM

## 2015-08-20 NOTE — Progress Notes (Signed)
Labor Progress Note Melissa Mcclain is a 29 y.o. G1P0 at [redacted]w[redacted]d presented for SOL  S: Patient resting in bed. Epidural in place   O:  BP 120/72 mmHg  Pulse 86  Temp(Src) 97.8 F (36.6 C) (Oral)  Resp 18  Ht 5\' 2"  (1.575 m)  Wt 131 lb (59.421 kg)  BMI 23.95 kg/m2  SpO2 100%  LMP 11/07/2014 EFM: 135 bpm/moderate/accelerations present   CVE: Dilation: 8.5 Effacement (%): 90 Cervical Position: Middle Station: +2 Presentation: Vertex Exam by:: Yvonne Kendall, CNM   A&P: 29 y.o. G1P0 [redacted]w[redacted]d with SOL   #Labor: Expectant management  #Pain: Epidural  #FWB: Category 1  #GBS: negative   Lenoard Helbert Criss Rosales, MD 9:13 AM

## 2015-08-21 ENCOUNTER — Encounter (HOSPITAL_COMMUNITY): Payer: Self-pay | Admitting: *Deleted

## 2015-08-21 LAB — ABO/RH: ABO/RH(D): O POS

## 2015-08-21 NOTE — Lactation Note (Signed)
This note was copied from the chart of [place]. Lactation Consultation Note  Patient Name: Melissa Mcclain [id number] Date: 08/21/2015 Reason for consult: Initial assessment;Breast/nipple pain  Visited with Mom, baby 72 hrs old.  Mom having difficulty with pain on latch.  Both nipple tips reddened.  Mom had tried laid back nursing with doula.  Assisted with latching in football position.  Guidance given in positioning and latch, baby latched after a couple attempts.  Taught Mom how to manually express colostrum onto nipple for soreness.  Comfort Gels given with instructions. Teaching and reassurance given.  Encouraged skin to skin and cue based feedings.  Brochure left in room, explained about IP and OP lactation services available to her.       Maternal Data Formula Feeding for Exclusion: No Has patient been taught Hand Expression?: Yes Does the patient have breastfeeding experience prior to this delivery?: No  Feeding Feeding Type: Breast Fed Length of feed: 10 min  LATCH Score/Interventions Latch: Grasps breast easily, tongue down, lips flanged, rhythmical sucking. Intervention(s): Adjust position;Assist with latch;Breast massage;Breast compression  Audible Swallowing: Spontaneous and intermittent Intervention(s): Alternate breast massage;Hand expression;Skin to skin  Type of Nipple: Everted at rest and after stimulation  Comfort (Breast/Nipple): Filling, red/small blisters or bruises, mild/mod discomfort  Problem noted: Cracked, bleeding, blisters, bruises;Mild/Moderate discomfort Interventions  (Cracked/bleeding/bruising/blister): Expressed breast milk to nipple Interventions (Mild/moderate discomfort): Comfort gels  Hold (Positioning): Assistance needed to correctly position infant at breast and maintain latch. Intervention(s): Breastfeeding basics reviewed;Support Pillows;Position options;Skin to skin  LATCH Score: 8  Lactation Tools Discussed/Used Tools:  Comfort gels   Consult Status Consult Status: Follow-up Date: 08/22/15 Follow-up type: In-patient    Broadus John 08/21/2015, 3:48 PM

## 2015-08-21 NOTE — Anesthesia Postprocedure Evaluation (Signed)
Anesthesia Post Note  Patient: Melissa Mcclain  Procedure(s) Performed: * No procedures listed *  Patient location during evaluation: Mother Baby Anesthesia Type: Epidural Level of consciousness: awake and alert Pain management: pain level controlled Vital Signs Assessment: post-procedure vital signs reviewed and stable Respiratory status: spontaneous breathing Cardiovascular status: blood pressure returned to baseline and stable Postop Assessment: no headache, no backache, patient able to bend at knees and no signs of nausea or vomiting Anesthetic complications: no    Last Vitals:  Filed Vitals:   08/21/15 0330 08/21/15 0641  BP: 97/63 96/67  Pulse: 88 85  Temp: 36.9 C 36.4 C  Resp: 18 18    Last Pain:  Filed Vitals:   08/21/15 0856  PainSc: 6                  Melissa Mcclain

## 2015-08-21 NOTE — Clinical Social Work Maternal (Signed)
CLINICAL SOCIAL WORK MATERNAL/CHILD NOTE  Patient Details  Name: Melissa Mcclain MRN: [id number] Date of Birth: [birth year]  Date:  08/21/2015  Clinical Social Worker Initiating Note:  Lucita Ferrara MSW, LCSW Date/ Time Initiated:  08/21/15/1030    Child's Name:  Melissa Mcclain   Legal Guardian:  Lenna Sciara and Lucile Crater  Need for Interpreter:  None   Date of Referral:  08/20/15     Reason for Referral:  History of anxiety and depression  Referral Source:  [facility]   Address:  [address] [place], [place] [address]  Phone number:  [phone]   Household Members:  Spouse   Natural Supports (not living in the home):  Immediate Family, Extended Family   Professional Supports: None   Employment: Full-time   Type of Work: [employer]   Education:  Geophysical data processor Resources:  Multimedia programmer   Other Resources:    None identified   Cultural/Religious Considerations Which May Impact Care:  None reported  Strengths:  Home prepared for child , Pediatrician chosen , Ability to meet basic needs    Risk Factors/Current Problems:   1. Mental Health Concerns: MOB presents with history of anxiety and depression.  MOB was previously prescribed Zoloft, but discontinued the medication with the +UPT.  During the pregnancy, MOB identified feeling anxious.  As she transitions postpartum, she stated that she continues to experience anxious thoughts.  Cognitive State:  Able to Concentrate , Alert , Goal Oriented , Linear Thinking    Mood/Affect:  Happy , Anxious    CSW Assessment:  CSW received request for consult due to MOB presenting with a history of anxiety and depression.  MOB displayed a full range in affect, was in a pleasant mood, but speech was slightly pressured, with occasional laughter indicating feeling anxious during the assessment.  FOB was also present in the room, but participated minimally.  Per MOB, childbirth was born painful than  she anticipated and expected. She stated that she is "exhausted", but shared belief that all was worth it.  MOB endorsed feeling instant bond, and she shared that she is happy about the bond since she had read that it is not uncommon to not feel an immediate bond with a newborn.  MOB reported that breastfeeding is important to her, but expressed frustration since it is painful.  MOB shared that prior to the infant's birth, she knew that breastfeeding could be difficult, challenging, and painful. She reported that she intends to continue to put forth effort since she knows that it is a learning experience for both herself and the infant. MOB and FOB stated that they have a support system, but MOB's comments indicated that it can be difficult at times since their support system can be intrusive and not ask for help first.  MOB shared that they intend to establish boundaries with her support system.  CSW and MOB continued to explore the balance between accepting help and feeling capable/able to care for the infant as wanted/desired.   MOB confirmed prior history of depression and anxiety. Per chart review, she presents with history of greater than 3 years.  MOB stated that she has previously been prescribed Zoloft, and discontinued the medication with the +UPT.  MOB denied any depressive symptoms during the pregnancy since she was "too busy". During the pregnancy, MOB reported that she was working, in graduate school, and preparing for the infant and frequently felt anxious and overwhelmed.  MOB stated that she experienced racing anxious  thoughts, and felt that she was "on edge" and more irritable than normal.  MOB that it was helpful for her to distract herself and go for walks in order to reduce anxieties.  She shared that she feels "better" now that the infant has been born, but she continues to experience anxiety and questions of "what if this happens".   MOB denied current plans to re-start Zoloft at this time,  and shared that she wants to "wait and see".    CSW provided education on perinatal mood and anxiety disorders, and shared clinical impressions that MOB's current anxieties and anxieties during the pregnancy are congruent with anxiety and perinatal anxiety.  MOB shared that she has never heard of postpartum anxiety, but presented as attentive and education as CSW normalized and discussed symptoms.  CSW continued to explore with MOB cognitive techniques that may assist her to disengage from anxieties, including how to de-catastrophize fears, engage in positive self-talk, and challenge untrue/incomplete thoughts.  MOB and FOB demonstrated ability to utilize these techniques, and appeared receptive to additional exploring additional self-care techniques that will help support the MOB's mental health.    MOB and FOB agreed to closely monitor MOB's mental health, and agreed to closely follow up with the MOB's medical provider if she notes increase in frequency and severity of anxieties.   CSW Plan/Description:   1. MOB presents with common signs and symptoms of anxiety, in particular anxieties secondary to parenting and caring for the infant.   CSW recommends close monitoring and follow up of MOB's mood and anxieties.   2.. CSW provided Patient/Family Education on Perinatal mood and anxiety disorders  3.  No Further Intervention Required/No Barriers to Discharge    Sharyl Nimrod 08/21/2015, 1:03 PM

## 2015-08-21 NOTE — Progress Notes (Signed)
Post Partum Day 1 Subjective:  Melissa Mcclain is a 29 y.o. G1P1001 [redacted]w[redacted]d s/p IOL for post dates and VAVD.  No acute events overnight.  Pt denies problems with ambulating, voiding or po intake.  She denies nausea or vomiting.  Pain is well controlled.  She has had flatus.  Lochia Moderate.  Plan for birth control is unsure at this time.  Method of Feeding: Breast.   Objective: Blood pressure 97/63, pulse 88, temperature 98.5 F (36.9 C), temperature source Oral, resp. rate 18, height 5\' 2"  (1.575 m), weight 59.421 kg (131 lb), last menstrual period 11/07/2014, SpO2 98 %, unknown if currently breastfeeding.  Physical Exam:  General: alert, cooperative and no distress Lochia:normal flow Chest: normal WOB Heart: Regular rate Abdomen: +BS, soft, mild TTP (appropriate) Uterine Fundus: firm, below umbillicus DVT Evaluation: No evidence of DVT seen on physical exam. Extremities: No edema   Recent Labs  08/19/15 2305  HGB 11.4*  HCT 34.9*    Assessment/Plan:  ASSESSMENT: Melissa Mcclain is a 29 y.o. G1P1001 [redacted]w[redacted]d s/p VAVD with laceration repair.   Plan for discharge tomorrow or the next day. Continue routine PP care Breastfeeding support PRN  LOS: 2 days   Stormy Card 08/21/2015, 6:40 AM

## 2015-08-22 DIAGNOSIS — Z8759 Personal history of other complications of pregnancy, childbirth and the puerperium: Secondary | ICD-10-CM

## 2015-08-22 DIAGNOSIS — O09299 Supervision of pregnancy with other poor reproductive or obstetric history, unspecified trimester: Secondary | ICD-10-CM

## 2015-08-22 MED ORDER — IBUPROFEN 600 MG PO TABS: 600.0000 mg | ORAL_TABLET | Freq: Four times a day (QID) | ORAL | Status: DC

## 2015-08-22 NOTE — Progress Notes (Signed)
Discussed warning signs for mom & baby, safe sleep, and car seats. Questions about circumcision answered. Reviewed Baby & Me folder for home care.

## 2015-08-22 NOTE — Discharge Instructions (Signed)

## 2015-08-22 NOTE — Lactation Note (Signed)
This note was copied from the chart of [place]. Lactation Consultation Note  Baby sleeping after circ in mother's arms. Mother's nipples red and sore.  Has comfort gels.  Mother states it feels like baby is biting. Suggest she call when baby wake to do an oral assessment. Reviewed cluster feeding, hand pump use.  Reviewed engorgement care and monitoring voids/stools.   Patient Name: Melissa Mcclain [id number] Date: 08/22/2015 Reason for consult: Follow-up assessment   Maternal Data    Feeding Feeding Type: Breast Fed Length of feed: 0 min  LATCH Score/Interventions Latch: Grasps breast easily, tongue down, lips flanged, rhythmical sucking. Intervention(s): Adjust position (changed from cradle to cross cradle)  Audible Swallowing: A few with stimulation Intervention(s): Skin to skin  Type of Nipple: Everted at rest and after stimulation  Comfort (Breast/Nipple): Filling, red/small blisters or bruises, mild/mod discomfort  Problem noted: Mild/Moderate discomfort Interventions (Mild/moderate discomfort): Hand expression;Comfort gels  Hold (Positioning): Assistance needed to correctly position infant at breast and maintain latch.  LATCH Score: 7  Lactation Tools Discussed/Used     Consult Status Consult Status: PRN    Vivianne Master [facility] 08/22/2015, 9:14 AM

## 2015-08-22 NOTE — Discharge Summary (Signed)
OB Discharge Summary     Patient Name: Melissa Mcclain DOB: [birth year] MRN: [id number]  Date of admission: 08/19/2015 Delivering MD: Mora Bellman   Date of discharge: 08/22/2015  Admitting diagnosis: 52 WKS, CTXS Intrauterine pregnancy: [redacted]w[redacted]d     Secondary diagnosis:  Principal Problem:   Status post vacuum-assisted vaginal delivery Active Problems:   Asthma, cough variant   Anxiety and depression   Supervision of normal first pregnancy   Cystic fibrosis carrier   Active labor at term   Obstetrical laceration, second degree  Additional problems: None     Discharge diagnosis: Term Pregnancy Delivered                                                                                                Post partum procedures:none  Augmentation: AROM and Pitocin  Complications: None  Hospital course:  Onset of Labor With Vaginal Delivery     29 y.o. yo G1P1001 at [redacted]w[redacted]d was admitted in Latent Laboron 08/19/2015. Patient had an uncomplicated labor course as follows:  Membrane Rupture Time/Date: 2:49 PM ,08/20/2015   Intrapartum Procedures: Episiotomy: None [1]                                         Lacerations:  2nd degree [3];Perineal [11]  Patient had a delivery of a Viable infant. 08/20/2015  Information for the patient's newborn:  Candias, Tackitt [id number]  Delivery Method: Vaginal, Vacuum (Extractor) (Filed from Delivery Summary)   The patient pushed for 3 hours VAVD was performed for maternal exhaustion after appropriate consent process.  Pateint had an uncomplicated postpartum course.  She is ambulating, tolerating a regular diet, passing flatus, and urinating well. Patient is discharged home in stable condition on 08/22/2015 .  Physical exam  Filed Vitals:   08/21/15 0330 08/21/15 0641 08/21/15 1213 08/21/15 1838  BP: 97/63 96/67 105/71 120/75  Pulse: 88 85 90 92  Temp: 98.5 F (36.9 C) 97.5 F (36.4 C) 98.6 F (37 C) 98.1 F (36.7 C)  TempSrc: Oral Oral   Oral  Resp: 18 18  16   Height:      Weight:      SpO2: 98%  98%    General: alert, cooperative and no distress Lochia: appropriate Uterine Fundus: firm Incision: N/A DVT Evaluation: No evidence of DVT seen on physical exam. Negative Homan's sign. Labs: Lab Results  Component Value Date   WBC 13.2* 08/19/2015   HGB 11.4* 08/19/2015   HCT 34.9* 08/19/2015   MCV 94.3 08/19/2015   PLT 172 08/19/2015   CMP Latest Ref Rng 12/15/2014  Glucose 70 - 99 mg/dL 78  BUN 6 - 23 mg/dL 12  Creatinine 0.50 - 1.10 mg/dL 0.64  Sodium 135 - 145 mEq/L 136  Potassium 3.5 - 5.3 mEq/L 4.0  Chloride 96 - 112 mEq/L 102  CO2 19 - 32 mEq/L 22  Calcium 8.4 - 10.5 mg/dL 9.8  Total Protein 6.0 - 8.3 g/dL 7.1  Total Bilirubin 0.2 - 1.2 mg/dL  0.5  Alkaline Phos 39 - 117 U/L 58  AST 0 - 37 U/L 13  ALT 0 - 35 U/L 9    Discharge instruction: per After Visit Summary and "Baby and Me Booklet".  After visit meds:    Medication List    TAKE these medications        acetaminophen 500 MG tablet  Commonly known as:  TYLENOL  Take 1,000 mg by mouth every 6 (six) hours as needed for mild pain or headache.     ibuprofen 600 MG tablet  Commonly known as:  ADVIL,MOTRIN  Take 1 tablet (600 mg total) by mouth every 6 (six) hours.     prenatal multivitamin Tabs tablet  Take 1 tablet by mouth daily.        Diet: routine diet  Activity: Advance as tolerated. Pelvic rest for 6 weeks.   Outpatient follow up:6 weeks   Follow up Appt:No future appointments.  Postpartum contraception: Unsure  Newborn Data: Live born female  Birth Weight: 8 lb 0.2 oz (3635 g) APGAR: 8, 9  Baby Feeding: Breast Disposition:home with mother   08/22/2015 Caren Macadam, MD

## 2015-08-28 ENCOUNTER — Other Ambulatory Visit (INDEPENDENT_AMBULATORY_CARE_PROVIDER_SITE_OTHER): Payer: BLUE CROSS/BLUE SHIELD

## 2015-08-28 VITALS — Temp 98.2°F

## 2015-08-28 DIAGNOSIS — R300 Dysuria: Secondary | ICD-10-CM | POA: Diagnosis not present

## 2015-08-28 NOTE — Progress Notes (Signed)
Pt is here s/p vaginal delivery with c/o's burning with urination.  Urine Dip today is neg except for large blood due to recent delivery.  Perineum is slightly swollen but does not appear to have any unusual redness.  Pt is afebrile today.  Urine culture will be sent and patient was given some Lidocaine jelly to apply to perineum prior to voiding.

## 2015-08-31 LAB — CULTURE, URINE COMPREHENSIVE: Colony Count: 10000

## 2015-10-08 ENCOUNTER — Encounter: Payer: Self-pay | Admitting: Obstetrics & Gynecology

## 2015-10-08 ENCOUNTER — Ambulatory Visit (INDEPENDENT_AMBULATORY_CARE_PROVIDER_SITE_OTHER): Payer: BLUE CROSS/BLUE SHIELD | Admitting: Obstetrics & Gynecology

## 2015-10-08 VITALS — BP 112/74 | HR 85 | Resp 16 | Ht 62.0 in | Wt 110.0 lb

## 2015-10-08 DIAGNOSIS — E049 Nontoxic goiter, unspecified: Secondary | ICD-10-CM | POA: Diagnosis not present

## 2015-10-08 MED ORDER — NORETHINDRONE 0.35 MG PO TABS: 1.0000 | ORAL_TABLET | Freq: Every day | ORAL | Status: DC

## 2015-10-08 NOTE — Progress Notes (Signed)
Patient ID: Melissa Mcclain, female   DOB: September 23, 1985, 30 y.o.   MRN: [id number] Post Partum Exam  Melissa Mcclain is a 30 y.o. G1P1001 female who presents for a postpartum visit. She is 7 weeks postpartum following a spontaneous vaginal delivery. I have fully reviewed the prenatal and intrapartum course. The delivery was at 40w 6d gestational weeks.  Anesthesia: epidural. Postpartum course has been unremarkable. Baby's course has been unremarkable. Baby is feeding by breast. Bleeding no bleeding. Bowel function is normal. Bladder function is normal. Patient is sexually active. Contraception method is none. Postpartum depression screening: negative.  The following portions of the patient's history were reviewed and updated as appropriate: allergies, current medications, past family history, past medical history, past social history, past surgical history and problem list.  Review of Systems Pertinent items noted in HPI and remainder of comprehensive ROS otherwise negative.   Objective:    BP 116/78 mmHg  Pulse 78  Resp 16  Ht 5\' 5"  (1.651 m)  Wt 211 lb (95.709 kg)  BMI 35.11 kg/m2  Breastfeeding? Yes  General:  alert, cooperative and no distress   Breasts:  inspection negative, no nipple discharge or bleeding, no masses or nodularity palpable  Lungs: clear to auscultation bilaterally  Heart:  regular rate and rhythm  Abdomen: soft, non-tender; bowel sounds normal; no masses,  no organomegaly   Vulva:  normal  Vagina: ?small amount of granulation tissue at fourchette at sight of laceration.  Cervix:  no lesions  Corpus: normal size, contour, position, consistency, mobility, non-tender  Adnexa:  normal adnexa  Rectal Exam: Not performed.        Assessment:    nml postpartum exam.  ?small amount of granulation tissue at site of laceration   Plan:    1. Contraception: oral progesterone-only contraceptive; considering Mirena 2. RTC 2 weeks to evaluate laceration site--consider silver  nitrate if tissue still present. 3-Tight pelvic diaphragm--kegel exercise, consider pelvic PT 3. Follow up in: 2 weeks or as needed.

## 2015-10-09 ENCOUNTER — Telehealth: Payer: Self-pay | Admitting: *Deleted

## 2015-10-09 LAB — TSH: TSH: 1.85 mIU/L

## 2015-10-09 NOTE — Telephone Encounter (Signed)
Pt notiifed of normal TSH level

## 2015-10-11 ENCOUNTER — Telehealth: Payer: Self-pay | Admitting: *Deleted

## 2015-10-11 NOTE — Telephone Encounter (Signed)
I have already contacted pt about normal TSH yesterday

## 2015-10-11 NOTE — Telephone Encounter (Signed)
-----   Message from Guss Bunde, MD sent at 10/11/2015  9:47 AM EST ----- Nml TSH.  RN to call.  Encourage sign up for my chart

## 2015-11-01 ENCOUNTER — Ambulatory Visit: Payer: BLUE CROSS/BLUE SHIELD | Admitting: Obstetrics & Gynecology

## 2015-12-06 ENCOUNTER — Ambulatory Visit (INDEPENDENT_AMBULATORY_CARE_PROVIDER_SITE_OTHER): Payer: BLUE CROSS/BLUE SHIELD | Admitting: Obstetrics & Gynecology

## 2015-12-06 ENCOUNTER — Encounter: Payer: Self-pay | Admitting: Obstetrics & Gynecology

## 2015-12-06 VITALS — BP 98/62 | HR 88 | Ht 62.0 in | Wt 103.0 lb

## 2015-12-06 DIAGNOSIS — Z0000 Encounter for general adult medical examination without abnormal findings: Secondary | ICD-10-CM

## 2015-12-06 DIAGNOSIS — N898 Other specified noninflammatory disorders of vagina: Secondary | ICD-10-CM

## 2015-12-06 DIAGNOSIS — M25511 Pain in right shoulder: Secondary | ICD-10-CM | POA: Diagnosis not present

## 2015-12-06 DIAGNOSIS — N942 Vaginismus: Secondary | ICD-10-CM

## 2015-12-06 MED ORDER — ALPRAZOLAM 0.5 MG PO TABS: ORAL_TABLET | ORAL | Status: DC

## 2015-12-06 MED ORDER — MISOPROSTOL 200 MCG PO TABS: ORAL_TABLET | ORAL | Status: DC

## 2015-12-07 NOTE — Progress Notes (Signed)
   Subjective:    Patient ID: Melissa Mcclain, female    DOB: 1985-12-15, 30 y.o.   MRN: [id number]  HPI  30 year old female presents for follow-up of vaginal laceration and evaluation for dyspareunia and vaginal pain. The laceration repair is completely healed with no granulation tissue. The patient still has markedly involuntary contraction of the introitus consistent with vaginismus. Patient is tender over bilateral ischial spines and has some pelvic floor dysfunction. Intercourse has proved to be painful postpartum. She did not have dyspareunia before pregnancy.  Review of Systems  Constitutional: Negative.   Respiratory: Negative.   Cardiovascular: Negative.   Gastrointestinal: Negative.   Genitourinary: Positive for vaginal pain.  Musculoskeletal:       Unable to raise right arm over head.   Psychiatric/Behavioral: Negative.        Objective:   Physical Exam  Constitutional: She is oriented to person, place, and time. She appears well-developed and well-nourished. No distress.  HENT:  Head: Normocephalic and atraumatic.  Eyes: Conjunctivae are normal.  Pulmonary/Chest: Effort normal.  Abdominal: Soft. Bowel sounds are normal. She exhibits no distension and no mass. There is no tenderness. There is no rebound and no guarding.  Genitourinary:  Tanner 5 Perineum intact with no lacerations scarring or any granulation tissue Vagina shows evidence of vaginismus and pelvic floor dysfunction Cervix close nontender Uterus anteverted nontender Adnexa no masses nontender  Musculoskeletal: She exhibits no edema.  Neurological: She is alert and oriented to person, place, and time.  Skin: Skin is warm and dry.  Psychiatric: She has a normal mood and affect.  Vitals reviewed.         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 She is opted for the Mirena IUD. Given her vaginismus she has an anxiety about the insertion. We'll give her Cytotec vaginally prior to procedure. We'll also prescribe her 2 tablets of Xanax to help with her anxiety around the upcoming procedure. Asian given instructions of how to take the Cytotec and Xanax Will refer patient to family practice, Dr. Rosalio Loud for right shoulder and arm issues

## 2015-12-17 ENCOUNTER — Encounter: Payer: Self-pay | Admitting: Sports Medicine

## 2015-12-17 ENCOUNTER — Ambulatory Visit (INDEPENDENT_AMBULATORY_CARE_PROVIDER_SITE_OTHER): Payer: BLUE CROSS/BLUE SHIELD

## 2015-12-17 ENCOUNTER — Ambulatory Visit (INDEPENDENT_AMBULATORY_CARE_PROVIDER_SITE_OTHER): Payer: BLUE CROSS/BLUE SHIELD | Admitting: Sports Medicine

## 2015-12-17 VITALS — BP 125/83 | HR 86 | Resp 18 | Wt 99.5 lb

## 2015-12-17 DIAGNOSIS — M542 Cervicalgia: Secondary | ICD-10-CM

## 2015-12-17 DIAGNOSIS — M958 Other specified acquired deformities of musculoskeletal system: Secondary | ICD-10-CM

## 2015-12-17 DIAGNOSIS — M25511 Pain in right shoulder: Secondary | ICD-10-CM

## 2015-12-17 MED ORDER — PREDNISONE 50 MG PO TABS: ORAL_TABLET | ORAL | Status: DC

## 2015-12-17 NOTE — Assessment & Plan Note (Signed)
In the absence of trauma this likely represents deconditioning of the scapula protractors. Prednisone for 5 days, x-rays of the neck and shoulder, as sometimes cervical radiculopathy can present in this fashion. Aggressive formal physical therapy centered on scapular stabilization as well as cervical rehabilitation. Return to see me in one month, MRI if no better. Pain is gone, principal symptom now is weakness and loss of range of motion.

## 2015-12-17 NOTE — Progress Notes (Signed)
   Subjective:    I'm seeing this patient as a consultation for:  Dr. Silas [place]  CC: right shoulder pain  HPI: This is a pleasant 30 year old female, postpartum, for the past several weeks she's had increasing stiffness along the medial border of the scapula with visible prominence of her right scapula. Minimal neck pain and no radicular symptoms or paresthesias into the arm or hand. She simply has stiffness, weakness, and loss of some of her range of motion.  Past medical history, Surgical history, Family history not pertinant except as noted below, Social history, Allergies, and medications have been entered into the medical record, reviewed, and no changes needed.   Review of Systems: No headache, visual changes, nausea, vomiting, diarrhea, constipation, dizziness, abdominal pain, skin rash, fevers, chills, night sweats, weight loss, swollen lymph nodes, body aches, joint swelling, muscle aches, chest pain, shortness of breath, mood changes, visual or auditory hallucinations.   Objective:   General: Well Developed, well nourished, and in no acute distress.  Neuro/Psych: Alert and oriented x3, extra-ocular muscles intact, able to move all 4 extremities, sensation grossly intact. Skin: Warm and dry, no rashes noted.  Respiratory: Not using accessory muscles, speaking in full sentences, trachea midline.  Cardiovascular: Pulses palpable, no extremity edema. Abdomen: Does not appear distended. Right Shoulder: Visible scapular winging on the right side, somewhat weak to flexion of the shoulder. Palpation is normal with no tenderness over AC joint or bicipital groove. ROM is full in all planes. Rotator cuff strength normal throughout. No signs of impingement with negative Neer and Hawkin's tests, empty can. Speeds and Yergason's tests normal. No labral pathology noted with negative Obrien's, negative crank, negative clunk, and good stability. Normal scapular function observed. No  painful arc and no drop arm sign. No apprehension sign  Impression and Recommendations:   This case required medical decision making of moderate complexity.

## 2015-12-19 ENCOUNTER — Ambulatory Visit (INDEPENDENT_AMBULATORY_CARE_PROVIDER_SITE_OTHER): Payer: BLUE CROSS/BLUE SHIELD | Admitting: Rehabilitative and Restorative Service Providers"

## 2015-12-19 ENCOUNTER — Encounter: Payer: Self-pay | Admitting: Rehabilitative and Restorative Service Providers"

## 2015-12-19 DIAGNOSIS — R293 Abnormal posture: Secondary | ICD-10-CM

## 2015-12-19 DIAGNOSIS — M6281 Muscle weakness (generalized): Secondary | ICD-10-CM

## 2015-12-19 DIAGNOSIS — R29898 Other symptoms and signs involving the musculoskeletal system: Secondary | ICD-10-CM

## 2015-12-19 NOTE — Patient Instructions (Signed)
Self massage using ~4 inch rubber ball  Swim noodle for postural work and for sitting   Shoulder Blade Squeeze   Can use swim noodle along spine for tactile cue.  Rotate shoulders back, then squeeze shoulder blades down and back Hold 10 sec Repeat __5- 10__ times. Do _several ___ sessions per day.  Axial Extension (Chin Tuck)    Pull chin in and lengthen back of neck. Hold __5-10__ seconds while counting out loud. Repeat __5-10__ times. Do _several___ sessions per day.  Neurovascular: Median Nerve Stretch - Supine    Lie with neck supported, side-bent away from moving arm. Hold right arm out to side, elbow bent, thumb down, fingers and wrist bent back. Slowly straighten elbowas far as possible without pain. Hold for __60__ seconds. (Let your tingling be your guide - stop when you have any numbness) Repeat __1-2__ times per set. Do ___2_ times/day   TENS UNIT: This is helpful for muscle pain and spasm.   Search and Purchase a TENS 7000 2nd edition at www.tenspros.com. It should be less than $30.     TENS unit instructions: Do not shower or bathe with the unit on Turn the unit off before removing electrodes or batteries If the electrodes lose stickiness add a drop of water to the electrodes after they are disconnected from the unit and place on plastic sheet. If you continued to have difficulty, call the TENS unit company to purchase more electrodes. Do not apply lotion on the skin area prior to use. Make sure the skin is clean and dry as this will help prolong the life of the electrodes. After use, always check skin for unusual red areas, rash or other skin difficulties. If there are any skin problems, does not apply electrodes to the same area. Never remove the electrodes from the unit by pulling the wires. Do not use the TENS unit or electrodes other than as directed. Do not change electrode placement without consultating your therapist or physician. Keep 2 fingers with  between each electrode.

## 2015-12-19 NOTE — Therapy (Signed)
[place], [place], [address] Phone: [phone]   Fax:  [phone]  Physical Therapy Evaluation  Patient Details  Name: Melissa Mcclain MRN: [id number] Date of Birth: [birth year] Referring Provider: Dr. Dianah Field   Encounter Date: 12/19/2015      PT End of Session - 12/19/15 0935    Visit Number 1   Number of Visits 12   Date for PT Re-Evaluation 01/30/16   PT Start Time 0935   PT Stop Time 1029   PT Time Calculation (min) 54 min   Activity Tolerance Patient tolerated treatment well      Past Medical History  Diagnosis Date  . Asthma   . Heart murmur     Past Surgical History  Procedure Laterality Date  . Tonsilectomy/adenoidectomy with myringotomy  1996  . Hernia repair  [id number]    There were no vitals filed for this visit.       Subjective Assessment - 12/19/15 0937    Subjective Patient reports that she has a new baby who is 30 months old and a large baby. She noticed that she felt a "crack" in her shoulder balde area ~ 3 months ago and has had difficulty lifting her arm and holding her arm up since that time.     Pertinent History Denies any other musculoskeletal problems - having some issues with pelvic pain    Diagnostic tests xrays (-)    Patient Stated Goals strengthening shoulder blade so she can carry her baby    Currently in Pain? Yes   Pain Location Shoulder   Pain Orientation Right   Pain Descriptors / Indicators --  pinch   Pain Type Acute pain   Pain Onset More than a month ago   Pain Frequency Intermittent   Aggravating Factors  using Rt UE; carry ing or holding baby    Pain Relieving Factors rest; heat; meds            [facility] PT Assessment - 12/19/15 0001    Assessment   Medical Diagnosis Winged Rt scapula   Referring Provider Dr. Dianah Field    Onset Date/Surgical Date 10/02/15   Hand Dominance Right   Next MD Visit 5/17   Prior Therapy none   Precautions   Precautions None   Balance Screen   Has the patient fallen in the past 6 months No   Has the patient had a decrease in activity level because of a fear of falling?  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    Leisure care for 59 month old - household chores; walking dog ~2 times/ wk 15-20 min    Observation/Other Assessments   Focus on Therapeutic Outcomes (FOTO)  41% limitation    Sensation   Additional Comments WFL   Posture/Postural Control   Posture Comments head forward; shoulders rounded and eelvated; head of the humerus anterior in orientation Rt > Lt; scapula abducted and rotated along the thoracic wall Rt >> LT    AROM   Right Shoulder Extension 47 Degrees   Right Shoulder Flexion 120 Degrees   Right Shoulder ABduction 123 Degrees   Right Shoulder Internal Rotation 67 Degrees   Right Shoulder External Rotation 24 Degrees   Left Shoulder Extension 54 Degrees   Left Shoulder Flexion 167 Degrees   Left Shoulder  ABduction 162 Degrees   Left Shoulder Internal Rotation 95 Degrees   Left Shoulder External Rotation 27 Degrees   Cervical Flexion 61   Cervical Extension 50   Cervical - Right Side Bend 31   Cervical - Left Side Bend 40   Cervical - Right Rotation 71   Cervical - Left Rotation 74   Strength   Overall Strength Comments winging of Rt scapula noted with manual muscle testing   Right/Left Shoulder --  Lt shd grossly 5/5    Right Shoulder Flexion 3-/5   Right Shoulder Extension 4/5   Right Shoulder ABduction 3-/5   Right Shoulder Internal Rotation 4+/5   Right Shoulder External Rotation 4-/5   Right Shoulder Horizontal ABduction 4+/5   Right Shoulder Horizontal ADduction 4/5   Palpation   Spinal mobility tightness through mid cervical to upper thoracic vertebrae with CPA mobs    Palpation comment tightness and  tenderness noted through Rt lateral cervical musculature; pecs; upper trap; leveator; periscapular musculature; thoracic paraspinals    Special Tests    Special Tests --  winging noted with shd elevation/forward pressure Rt UE                    [facility] Adult PT Treatment/Exercise - 12/19/15 0001    Therapeutic Activites    Therapeutic Activities --  myofacial ball release work    Neuro Re-ed    Neuro Re-ed Details  working on posture and alignment; pulling shoulder blades down and back    Shoulder Exercises: Supine   Other Supine Exercises neural mobilization 30-40 sec x 2 (gentle)    Shoulder Exercises: Standing   Other Standing Exercises scap squeeze 10 sec x 5 with noodle    Other Standing Exercises axial extension 10 sec x 5    Moist Heat Therapy   Number Minutes Moist Heat 20 Minutes   Moist Heat Location Cervical;Shoulder  thoracic spine    Electrical Stimulation   Electrical Stimulation Location periscapular Rt; Rt Secondary school teacher IFC   Electrical Stimulation Parameters to tolerance   Electrical Stimulation Goals Pain;Tone                PT Education - 12/19/15 1020    Education provided Yes   Education Details HEP; postural work; Biomedical scientist) Educated Patient   Methods Explanation;Demonstration;Tactile cues;Verbal cues;Handout   Comprehension Verbalized understanding;Returned demonstration;Verbal cues required;Tactile cues required             PT Long Term Goals - 12/19/15 1231    PT LONG TERM GOAL #1   Title Improve posture and alignment with improved position of scapula along the thoracic wall 01/30/16   Time 6   Period Weeks   Status New   PT LONG TERM GOAL #2   Title Increase AROM Rt shoulder to within 5-8 deg of AROM of Lt shoulder 01/30/16   Time 6   Period Weeks   Status New   PT LONG TERM GOAL #3   Title Improve strength Rt [place] to 4+/5 to 5/5 throughout 01/30/16   Time 6   Period Weeks   Status New    PT LONG TERM GOAL #4   Title I in HEP 01/30/16   Time 6   Period Weeks   Status New   PT LONG TERM GOAL #5   Title Improve FOTO to </= 29% limitation 01/30/16   Time 6   Period Weeks  Status New               Plan - 12/19/15 1226    Clinical Impression Statement Patient presents with winging of Rt scapula which started with lifting and nursing her infant son ~ 3 months ago. She initially had more pain than she now expereiences. She continues to have nerve pain with radicular Rt UE symptonms on an intermittent basis; difficulty lifting Rt UE.due to weakness; winging of Rt scapula; limited functional activity leve. 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 She will benefit from PT to address problems identified.    Rehab Potential Good   PT Frequency 2x / week   PT Duration 6 weeks   PT Treatment/Interventions Patient/family education;ADLs/Self Care Home Management;Neuromuscular re-education;Manual techniques;Dry needling;Cryotherapy;Electrical Stimulation;Iontophoresis 4mg /ml Dexamethasone;Moist Heat;Ultrasound;Therapeutic activities;Therapeutic exercise   PT Next Visit Plan postural correction; scapular stabilization; posterior shoulder girdle strengthening; manual work through [place] and pecs/trap; modalities as indicated   PT Home Exercise Plan postural correcton; HEP; myofacial release    Consulted and Agree with Plan of Care Patient      Patient will benefit from skilled therapeutic intervention in order to improve the following deficits and impairments:  Postural dysfunction, Improper body mechanics, Pain, Increased fascial restricitons, Decreased range of motion, Decreased strength, Decreased mobility, Decreased endurance, Decreased activity tolerance, Impaired UE functional use  Visit Diagnosis: Abnormal posture - Plan: PT plan of care cert/re-cert  Muscle weakness (generalized) - Plan: PT plan of care cert/re-cert  Other  symptoms and signs involving the musculoskeletal system - Plan: PT plan of care cert/re-cert     Problem List Patient Active Problem List   Diagnosis Date Noted  . 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 Status post vacuum-assisted vaginal delivery 08/22/2015  . 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 IDA (iron deficiency anemia) 08/16/2014  . Seasonal allergies 08/16/2014  . Asthma, cough variant 08/16/2014  . PMDD (premenstrual dysphoric disorder) 08/16/2014  . Anxiety and depression 08/16/2014  . Gastroesophageal reflux disease without esophagitis 08/16/2014  . Hyperlipidemia 08/16/2014    Quatisha Zylka Nilda Simmer PT, MPH  12/19/2015, 12:36 PM  [facility] [place], [place], [address] Phone: [phone]   Fax:  [phone]  Name: Melissa Mcclain MRN: [id number] Date of Birth: 1985/09/09

## 2015-12-21 ENCOUNTER — Ambulatory Visit (INDEPENDENT_AMBULATORY_CARE_PROVIDER_SITE_OTHER): Payer: BLUE CROSS/BLUE SHIELD | Admitting: Physical Therapy

## 2015-12-21 DIAGNOSIS — R293 Abnormal posture: Secondary | ICD-10-CM

## 2015-12-21 DIAGNOSIS — R29898 Other symptoms and signs involving the musculoskeletal system: Secondary | ICD-10-CM

## 2015-12-21 DIAGNOSIS — M6281 Muscle weakness (generalized): Secondary | ICD-10-CM

## 2015-12-21 NOTE — Patient Instructions (Addendum)
Resisted External Rotation: in Neutral - Bilateral   PALMS UP Sit or stand, tubing in both hands, elbows at sides, bent to 90, forearms forward. Pinch shoulder blades together and rotate forearms out. Keep elbows at sides. Repeat __10__ times per set. Do _2-3___ sets per session. Do _1___ sessions per day.  (elbows stay in, fists go out)    Low Row: Standing   Face anchor, feet shoulder width apart. Palms up, pull arms back, squeezing shoulder blades together. Repeat 10__ times per set. Do 2-3__ sets per session. Do 2-3__ sessions per week. Anchor Height: Waist  Strengthening: Resisted Extension   Hold tubing in right hand, arm forward. Pull arm back, elbow straight. Repeat _10___ times per set. Do 2-3____ sets per session. Do 3 sessions per wk.   [facility] [place], [place] [address]  [phone] (office) [phone] (fax)

## 2015-12-21 NOTE — Therapy (Signed)
[place], [place], [address] Phone: [phone]   Fax:  [phone]  Physical Therapy Treatment  Patient Details  Name: Melissa Mcclain MRN: [id number] Date of Birth: May 09, 1986 Referring Provider: Dr. Dianah Field   Encounter Date: 12/21/2015      PT End of Session - 12/21/15 0959    Visit Number 2   Number of Visits 12   Date for PT Re-Evaluation 01/30/16   PT Start Time 0935   PT Stop Time 1033   PT Time Calculation (min) 58 min   Activity Tolerance Patient tolerated treatment well      Past Medical History  Diagnosis Date  . Asthma   . Heart murmur     Past Surgical History  Procedure Laterality Date  . Tonsilectomy/adenoidectomy with myringotomy  1996  . Hernia repair  [id number]    There were no vitals filed for this visit.      Subjective Assessment - 12/21/15 1008    Currently in Pain? Yes   Pain Score 4    Pain Location Shoulder   Pain Orientation Right   Pain Descriptors / Indicators --  pinching    Pain Radiating Towards down towards Rt fingers    Pain Frequency Intermittent   Aggravating Factors  lifting RUE, reaching    Pain Relieving Factors rest, heat, meds            [facility] PT Assessment - 12/21/15 0001    Assessment   Medical Diagnosis Winged Rt scapula   Onset Date/Surgical Date 10/02/15   Hand Dominance Right   Next MD Visit 5/17   Prior Therapy none          [facility] Adult PT Treatment/Exercise - 12/21/15 0001    Shoulder Exercises: Supine   Flexion Both;10 reps   Theraband Level (Shoulder Flexion) Level 2 (Red)  overhead pull   Flexion Limitations (5 reps of AAROM flexion with cane - not difficult/ no stretch felt)  Full flexion ROM in supine.    ABduction Both;5 reps;AROM  (snow angel on pool noodle)   ABduction Limitations with RUE abducted fully, pt reported RUE became "numb" - stopped activity    Other Supine Exercises RUE neural  mobilization(per HEP) 20 sec x 4    Shoulder Exercises: Standing   External Rotation Both;12 reps;Theraband   Theraband Level (Shoulder External Rotation) Level 2 (Red)   Flexion AAROM;Right  3 reps (wall slide)   Flexion Limitations after 3 reps, pt reported radicular symptoms.    Extension Both;10 reps   Row Both;10 reps   Theraband Level (Shoulder Row) Level 2 (Red)   Retraction Strengthening;Both;10 reps  5 sec hold, noodle behind back   Other Standing Exercises axial extension 5 sec x 10 reps, tactile cues to improve form.    Shoulder Exercises: Stretch   Other Shoulder Stretches Doorway stretch 3 positions attempted: stretch felt in Lt pec, but only caused Rt scapular pain despite modifications.    Moist Heat Therapy   Number Minutes Moist Heat 15 Minutes   Moist Heat Location Shoulder  Rt   Electrical Stimulation   Electrical Stimulation Location Rt post scapula and periscapular muscles   sitting, pt unable to tolerate supine due to sciatica   Electrical Stimulation Action IFC   Electrical Stimulation Parameters  to tolerance    Electrical Stimulation Goals Pain          PT Education - 12/21/15 1008    Education provided Yes  Education Details HEP    Person(s) Educated Patient   Methods Explanation;Handout;Tactile cues;Demonstration;Verbal cues   Comprehension Verbalized understanding             PT Long Term Goals - 12/19/15 1231    PT LONG TERM GOAL #1   Title Improve posture and alignment with improved position of scapula along the thoracic wall 01/30/16   Time 6   Period Weeks   Status New   PT LONG TERM GOAL #2   Title Increase AROM Rt shoulder to within 5-8 deg of AROM of Lt shoulder 01/30/16   Time 6   Period Weeks   Status New   PT LONG TERM GOAL #3   Title Improve strength Rt [place] to 4+/5 to 5/5 throughout 01/30/16   Time 6   Period Weeks   Status New   PT LONG TERM GOAL #4   Title I in HEP 01/30/16   Time 6   Period Weeks   Status New   PT  LONG TERM GOAL #5   Title Improve FOTO to </= 29% limitation 01/30/16   Time 6   Period Weeks   Status New               Plan - 12/21/15 1021    Clinical Impression Statement Pt had increased radicular symptoms with standing Rt shoulder AAROM flexion as well as scap squeeze with axial extension. Pt unable to tolerate doorway stretch due to increased pain inferior to Rt scapula, and had increased symptoms with snow angel in hooklying.  Pt has difficulty tolerating hooklying position due to increased Rt sciatica symptoms.  Pt had sharp pain around Rt scapula when sitting with pool noodle behind back during portion of estim/MHP; resolved when noodle removed. No goals met; only second visit.    Rehab Potential Good   PT Frequency 2x / week   PT Duration 6 weeks   PT Treatment/Interventions Patient/family education;ADLs/Self Care Home Management;Neuromuscular re-education;Manual techniques;Dry needling;Cryotherapy;Electrical Stimulation;Iontophoresis '4mg'$ /ml Dexamethasone;Moist Heat;Ultrasound;Therapeutic activities;Therapeutic exercise   PT Next Visit Plan postural correction; scapular stabilization; posterior shoulder girdle strengthening; manual work through [place] and pecs/trap; modalities as indicated   Consulted and Agree with Plan of Care Patient      Patient will benefit from skilled therapeutic intervention in order to improve the following deficits and impairments:  Postural dysfunction, Improper body mechanics, Pain, Increased fascial restricitons, Decreased range of motion, Decreased strength, Decreased mobility, Decreased endurance, Decreased activity tolerance, Impaired UE functional use  Visit Diagnosis: Abnormal posture  Muscle weakness (generalized)  Other symptoms and signs involving the musculoskeletal system     Problem List Patient Active Problem List   Diagnosis Date Noted  . 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 Status post vacuum-assisted vaginal delivery  08/22/2015  . 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 IDA (iron deficiency anemia) 08/16/2014  . Seasonal allergies 08/16/2014  . Asthma, cough variant 08/16/2014  . PMDD (premenstrual dysphoric disorder) 08/16/2014  . Anxiety and depression 08/16/2014  . Gastroesophageal reflux disease without esophagitis 08/16/2014  . Hyperlipidemia 08/16/2014   Kerin Perna, PTA 12/21/2015 11:38 AM  [place], [place], [address] Phone: [phone]   Fax:  [phone]  Name: Melissa Mcclain MRN: [id number] Date of Birth: 1985-10-14

## 2015-12-24 ENCOUNTER — Ambulatory Visit (INDEPENDENT_AMBULATORY_CARE_PROVIDER_SITE_OTHER): Payer: BLUE CROSS/BLUE SHIELD | Admitting: Physical Therapy

## 2015-12-24 ENCOUNTER — Encounter: Payer: Self-pay | Admitting: Physical Therapy

## 2015-12-24 DIAGNOSIS — R29898 Other symptoms and signs involving the musculoskeletal system: Secondary | ICD-10-CM | POA: Diagnosis not present

## 2015-12-24 DIAGNOSIS — R293 Abnormal posture: Secondary | ICD-10-CM

## 2015-12-24 DIAGNOSIS — M6281 Muscle weakness (generalized): Secondary | ICD-10-CM | POA: Diagnosis not present

## 2015-12-24 NOTE — Patient Instructions (Signed)

## 2015-12-24 NOTE — Therapy (Signed)
[place], [place], [address] Phone: [phone]   Fax:  [phone]  Physical Therapy Treatment  Patient Details  Name: Melissa Mcclain MRN: [id number] Date of Birth: [birth year] Referring Provider: Dr Dianah Field  Encounter Date: 12/24/2015      PT End of Session - 12/24/15 0810    Visit Number 3   Number of Visits 12   Date for PT Re-Evaluation 01/30/16   PT Start Time 0810   PT Stop Time 0904   PT Time Calculation (min) 54 min   Activity Tolerance Patient tolerated treatment well      Past Medical History  Diagnosis Date  . Asthma   . Heart murmur     Past Surgical History  Procedure Laterality Date  . Tonsilectomy/adenoidectomy with myringotomy  1996  . Hernia repair  [id number]    There were no vitals filed for this visit.      Subjective Assessment - 12/24/15 0811    Subjective Pt reports she is doing ok with her new HEP using the band. Feels about the same, limited motion    Currently in Pain? Yes   Pain Score 4    Pain Location Shoulder   Pain Orientation Right   Pain Descriptors / Indicators Aching;Dull   Pain Type Acute pain   Pain Onset More than a month ago   Pain Frequency Intermittent   Aggravating Factors  reaching overhead at end range, getting shirt off, hooking bra   Pain Relieving Factors nothing            [facility] PT Assessment - 12/24/15 0001    Assessment   Medical Diagnosis Winged Rt scapula   Referring Provider Dr Dianah Field   Onset Date/Surgical Date 10/02/15   Hand Dominance Right   Next MD Visit 5/17   Prior Therapy none   AROM   Right Shoulder Flexion 158 Degrees   Right Shoulder ABduction 168 Degrees   Right Shoulder External Rotation 70 Degrees                     [facility] Adult PT Treatment/Exercise - 12/24/15 0001    Shoulder Exercises: Supine   External Rotation Strengthening;Both;Theraband;20 reps  with scap retraction    Theraband Level (Shoulder External Rotation) Level 2 (Red)   Flexion Strengthening;Both;10 reps;Theraband  overhead pull, propped with pillow/wedge to protect SIJ   Theraband Level (Shoulder Flexion) Level 2 (Red)   Other Supine Exercises 10 reps thoracic lifts, 5 sec holds   Modalities   Modalities Ultrasound;Electrical Stimulation;Moist Heat   Moist Heat Therapy   Number Minutes Moist Heat 15 Minutes   Moist Heat Location Shoulder  Rt posterior   Electrical Stimulation   Electrical Stimulation Location Rt post scapula and periscapular muscles    Electrical Stimulation Action IFC   Electrical Stimulation Parameters  to tolerance   Electrical Stimulation Goals Pain   Ultrasound   Ultrasound Location Rt posterior scap   Ultrasound Parameters combo us/stim   Ultrasound Goals Pain  tone   Manual Therapy   Manual Therapy Soft tissue mobilization   Soft tissue mobilization Rt infraspinatus          Trigger Point Dry Needling - 12/24/15 1129    Consent Given? Yes   Education Handout Provided Yes   Muscles Treated Upper Body --  Rt infraspinatus  - good twtch response  PT Long Term Goals - 12/24/15 1141    PT LONG TERM GOAL #1   Title Improve posture and alignment with improved position of scapula along the thoracic wall 01/30/16   Period Weeks   Status On-going   PT LONG TERM GOAL #2   Title Increase AROM Rt shoulder to within 5-8 deg of AROM of Lt shoulder 01/30/16   Time 6   Period Weeks   Status On-going  progressing   PT LONG TERM GOAL #3   Title Improve strength Rt [place] to 4+/5 to 5/5 throughout 01/30/16   Time 6   Period Weeks   Status On-going   PT LONG TERM GOAL #4   Title I in HEP 01/30/16   Time 6   Period Weeks   Status On-going   PT LONG TERM GOAL #5   Title Improve FOTO to </= 29% limitation 01/30/16   Time 6   Period Weeks   Status On-going               Plan - 12/24/15 1137    Clinical Impression Statement  Tomasa has had minimal improvement thus far, it is only her second week of PT. She has a trigger point in her Rt infraspinatus that reproduces her UE symptoms.  She had a good twitch response with TDN in this area.  She did become a little dizzy , recovered in a resting postiion with some water.  She reported decreased symptoms in that area afer treatment   Rehab Potential Good   PT Frequency 2x / week   PT Duration 6 weeks   PT Treatment/Interventions Patient/family education;ADLs/Self Care Home Management;Neuromuscular re-education;Manual techniques;Dry needling;Cryotherapy;Electrical Stimulation;Iontophoresis 4mg /ml Dexamethasone;Moist Heat;Ultrasound;Therapeutic activities;Therapeutic exercise   PT Next Visit Plan assess response to TDN, progress scap stab   Consulted and Agree with Plan of Care Patient      Patient will benefit from skilled therapeutic intervention in order to improve the following deficits and impairments:  Postural dysfunction, Improper body mechanics, Pain, Increased fascial restricitons, Decreased range of motion, Decreased strength, Decreased mobility, Decreased endurance, Decreased activity tolerance, Impaired UE functional use  Visit Diagnosis: Abnormal posture  Muscle weakness (generalized)  Other symptoms and signs involving the musculoskeletal system     Problem List Patient Active Problem List   Diagnosis Date Noted  . 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 Status post vacuum-assisted vaginal delivery 08/22/2015  . 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 IDA (iron deficiency anemia) 08/16/2014  . Seasonal allergies 08/16/2014  . Asthma, cough variant 08/16/2014  . PMDD (premenstrual dysphoric disorder) 08/16/2014  . Anxiety and depression 08/16/2014  . Gastroesophageal reflux disease without esophagitis 08/16/2014  . Hyperlipidemia 08/16/2014    Jeral Pinch PT 12/24/2015, 11:42 AM  [facility] [place]  [place], [place], [address] Phone: [phone]   Fax:  [phone]  Name: Dayjah Contee MRN: [id number] Date of Birth: 02-08-1986

## 2015-12-26 ENCOUNTER — Encounter: Payer: BLUE CROSS/BLUE SHIELD | Admitting: Rehabilitative and Restorative Service Providers"

## 2015-12-27 ENCOUNTER — Ambulatory Visit (INDEPENDENT_AMBULATORY_CARE_PROVIDER_SITE_OTHER): Payer: BLUE CROSS/BLUE SHIELD | Admitting: Physical Therapy

## 2015-12-27 DIAGNOSIS — R293 Abnormal posture: Secondary | ICD-10-CM | POA: Diagnosis not present

## 2015-12-27 DIAGNOSIS — R29898 Other symptoms and signs involving the musculoskeletal system: Secondary | ICD-10-CM

## 2015-12-27 DIAGNOSIS — M6281 Muscle weakness (generalized): Secondary | ICD-10-CM

## 2015-12-27 NOTE — Patient Instructions (Signed)
Sash   On back, knees bent, feet flat, left hand on left hip, right hand above left. Pull right arm DIAGONALLY (hip to shoulder) across chest. Bring right arm along head toward floor. Hold momentarily. Slowly return to starting position. Repeat _10__ times, rest then complete 2nd set (as tolerated). Do with left, then right arm. Band color __yellow____    [facility] [place], [place] [address]  [phone] (office) [phone] (fax)

## 2015-12-27 NOTE — Therapy (Signed)
[place], [place], [address] Phone: [phone]   Fax:  [phone]  Physical Therapy Treatment  Patient Details  Name: Frayda Barnell MRN: [id number] Date of Birth: 12-12-1985 Referring Provider: Dr. Suzanna Obey   Encounter Date: 12/27/2015      PT End of Session - 12/27/15 1408    Visit Number 4   Number of Visits 12   Date for PT Re-Evaluation 01/30/16   PT Start Time [id number]   PT Stop Time 1447   PT Time Calculation (min) 44 min      Past Medical History  Diagnosis Date  . Asthma   . Heart murmur     Past Surgical History  Procedure Laterality Date  . Tonsilectomy/adenoidectomy with myringotomy  1996  . Hernia repair  [id number]    There were no vitals filed for this visit.      Subjective Assessment - 12/27/15 1409    Subjective Pt reports she notices improved AROM in Rt shoulder.  May have overdone it with self massage to Rt shoulder girdle (was sore afterward).  Pt feels the TDN was very helpful.  Pt reports 50% improvement since beginning therapy.    Patient Stated Goals strengthening shoulder blade so she can carry her baby    Currently in Pain? No/denies            [facility] PT Assessment - 12/27/15 0001    Assessment   Medical Diagnosis Winged Rt scapula   Referring Provider Dr. Suzanna Obey    Onset Date/Surgical Date 10/02/15   Hand Dominance Right   Next MD Visit 01/14/16          [facility] Adult PT Treatment/Exercise - 12/27/15 0001    Shoulder Exercises: Supine   External Rotation Strengthening;Both;Theraband;20 reps  with scap retraction   Theraband Level (Shoulder External Rotation) Level 2 (Red)   Flexion Strengthening;Both;Theraband;12 reps  overhead pull, propped with pillow/wedge to protect SIJ   Theraband Level (Shoulder Flexion) Level 2 (Red)   Other Supine Exercises 10 reps thoracic lifts, 5 sec holds   Other Supine Exercises D2 sash with no resistance RUE  then 12 with red band, repeated on LUE.    Modalities   Modalities Electrical Stimulation;Ultrasound   Moist Heat Therapy   Number Minutes Moist Heat --  pt declined   Acupuncturist Stimulation Location Rt posterior shoulder   Electrical Stimulation Action combo   Electrical Stimulation Parameters to tolerance    Electrical Stimulation Goals Pain   Ultrasound   Ultrasound Location Rt posterior shoulder    Ultrasound Parameters 100%, 3.3 mHz, 1.2w/cm2, 8 min in sitting (with combo)   Ultrasound Goals Pain  tone   Manual Therapy   Manual Therapy Soft tissue mobilization;Taping   Soft tissue mobilization Edge tool assistance to Rt upper trap, levator, posterior shoulder, lat - to decrease fascial restrictions and decrease pain.    Kinesiotex --  Rock tape to [employer] and infraspinatus to decompress area                PT Education - 12/27/15 1703    Education provided Yes   Education Details Pt issued yellow band to perform sash exercise in supine    Person(s) Educated Patient   Methods Explanation;Handout   Comprehension Returned demonstration;Verbalized understanding             PT Long Term Goals - 12/24/15 1141    PT LONG TERM GOAL #1  Title Improve posture and alignment with improved position of scapula along the thoracic wall 01/30/16   Period Weeks   Status On-going   PT LONG TERM GOAL #2   Title Increase AROM Rt shoulder to within 5-8 deg of AROM of Lt shoulder 01/30/16   Time 6   Period Weeks   Status On-going  progressing   PT LONG TERM GOAL #3   Title Improve strength Rt [place] to 4+/5 to 5/5 throughout 01/30/16   Time 6   Period Weeks   Status On-going   PT LONG TERM GOAL #4   Title I in HEP 01/30/16   Time 6   Period Weeks   Status On-going   PT LONG TERM GOAL #5   Title Improve FOTO to </= 29% limitation 01/30/16   Time 6   Period Weeks   Status On-going             Patient will benefit from skilled  therapeutic intervention in order to improve the following deficits and impairments:     Visit Diagnosis: Abnormal posture  Muscle weakness (generalized)  Other symptoms and signs involving the musculoskeletal system     Problem List Patient Active Problem List   Diagnosis Date Noted  . 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 Status post vacuum-assisted vaginal delivery 08/22/2015  . 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 IDA (iron deficiency anemia) 08/16/2014  . Seasonal allergies 08/16/2014  . Asthma, cough variant 08/16/2014  . PMDD (premenstrual dysphoric disorder) 08/16/2014  . Anxiety and depression 08/16/2014  . Gastroesophageal reflux disease without esophagitis 08/16/2014  . Hyperlipidemia 08/16/2014   Kerin Perna, PTA 12/27/2015 5:05 PM  [place], [place], [address] Phone: [phone]   Fax:  [phone]  Name: Salaya Lenington MRN: [id number] Date of Birth: 08-May-1986

## 2015-12-31 ENCOUNTER — Encounter: Payer: Self-pay | Admitting: Physical Therapy

## 2015-12-31 ENCOUNTER — Ambulatory Visit (INDEPENDENT_AMBULATORY_CARE_PROVIDER_SITE_OTHER): Payer: BLUE CROSS/BLUE SHIELD | Admitting: Physical Therapy

## 2015-12-31 DIAGNOSIS — R293 Abnormal posture: Secondary | ICD-10-CM

## 2015-12-31 DIAGNOSIS — M6281 Muscle weakness (generalized): Secondary | ICD-10-CM | POA: Diagnosis not present

## 2015-12-31 DIAGNOSIS — R29898 Other symptoms and signs involving the musculoskeletal system: Secondary | ICD-10-CM

## 2015-12-31 NOTE — Therapy (Signed)
[place], [place], [address] Phone: [phone]   Fax:  [phone]  Physical Therapy Treatment  Patient Details  Name: Melissa Mcclain MRN: [id number] Date of Birth: Jan 15, 1986 Referring Provider: Dr Dianah Field  Encounter Date: 12/31/2015      PT End of Session - 12/31/15 1520    Visit Number 5   Number of Visits 12   Date for PT Re-Evaluation 01/30/16   PT Start Time 1520   PT Stop Time 1621   PT Time Calculation (min) 61 min   Activity Tolerance Patient limited by pain      Past Medical History  Diagnosis Date  . Asthma   . Heart murmur     Past Surgical History  Procedure Laterality Date  . Tonsilectomy/adenoidectomy with myringotomy  1996  . Hernia repair  [id number]    There were no vitals filed for this visit.      Subjective Assessment - 12/31/15 1522    Subjective Pt states she feels like she is getting better.    Currently in Pain? No/denies  had some pain earlier today and she had some numbness/tingling            [facility] PT Assessment - 12/31/15 0001    Assessment   Medical Diagnosis Winged Rt scapula   Referring Provider Dr Dianah Field   Onset Date/Surgical Date 10/02/15   Hand Dominance Right   Next MD Visit 01/14/16   ROM / Strength   AROM / PROM / Strength AROM;Strength   AROM   Overall AROM Comments bilat shoulders WNL, has tighness in Rt deltoid and postetior shoulder   Strength   Right Shoulder Flexion 4-/5   Right Shoulder Extension 4+/5   Right Shoulder ABduction 4/5   Right Shoulder Internal Rotation 5/5   Right Shoulder External Rotation 4+/5                     [facility] Adult PT Treatment/Exercise - 12/31/15 0001    Shoulder Exercises: Supine   Horizontal ABduction Strengthening;Both;20 reps;Theraband  on small noodle   Theraband Level (Shoulder Horizontal ABduction) Level 2 (Red)   Flexion Strengthening;Both;20 reps;Weights   lying on small noodle   Shoulder Flexion Weight (lbs) 1#   Shoulder Exercises: Pulleys   Flexion --  20 reps   ABduction --  20 reps - scaption   Shoulder Exercises: ROM/Strengthening   UBE (Upper Arm Bike) L1 x 2' BWD   Modalities   Modalities Electrical Stimulation;Moist Heat;Ultrasound   Moist Heat Therapy   Number Minutes Moist Heat 15 Minutes   Moist Heat Location Shoulder  and thoracic spine   Electrical Stimulation   Electrical Stimulation Location Rt infraspinatus/post shoulder   Electrical Stimulation Action burst   Electrical Stimulation Parameters to tolerance   Electrical Stimulation Goals Tone;Pain   Ultrasound   Ultrasound Location Rt post shoulder/infraspinatus   Ultrasound Parameters 100%, 3.65mz, 1.5 w/cm2   Ultrasound Goals Pain   Manual Therapy   Manual Therapy Soft tissue mobilization   Soft tissue mobilization Rt infraspinatus trigger point, flatter than last week                     PT Long Term Goals - 12/31/15 1528    PT LONG TERM GOAL #1   Title Improve posture and alignment with improved position of scapula along the thoracic wall 01/30/16   Status On-going   PT LONG TERM GOAL #2  Title Increase AROM Rt shoulder to within 5-8 deg of AROM of Lt shoulder 01/30/16   Status Achieved   PT LONG TERM GOAL #3   Title Improve strength Rt [place] to 4+/5 to 5/5 throughout 01/30/16   Status Partially Met   PT LONG TERM GOAL #4   Title I in HEP 01/30/16   Status On-going   PT LONG TERM GOAL #5   Title Improve FOTO to </= 29% limitation 01/30/16   Status On-going               Plan - 12/31/15 1611    Clinical Impression Statement Dellanira is having slow improvement, she has met one goal and partially met another.  Her strength is improving however she fatigues very quickly.  The trigger point in her Rt infraspinatus is flatter than it was however still present.,continues to be very tender to palpation. Paient doesn't tolerate a lot of soft  tissue work.    Rehab Potential Good   PT Frequency 2x / week   PT Duration 6 weeks   PT Treatment/Interventions Patient/family education;ADLs/Self Care Home Management;Neuromuscular re-education;Manual techniques;Dry needling;Cryotherapy;Electrical Stimulation;Iontophoresis 80m/ml Dexamethasone;Moist Heat;Ultrasound;Therapeutic activities;Therapeutic exercise   PT Next Visit Plan retape if skin has cleared up,    Consulted and Agree with Plan of Care Patient      Patient will benefit from skilled therapeutic intervention in order to improve the following deficits and impairments:  Postural dysfunction, Improper body mechanics, Pain, Increased fascial restricitons, Decreased range of motion, Decreased strength, Decreased mobility, Decreased endurance, Decreased activity tolerance, Impaired UE functional use  Visit Diagnosis: Abnormal posture  Muscle weakness (generalized)  Other symptoms and signs involving the musculoskeletal system     Problem List Patient Active Problem List   Diagnosis Date Noted  . 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 Status post vacuum-assisted vaginal delivery 08/22/2015  . 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 IDA (iron deficiency anemia) 08/16/2014  . Seasonal allergies 08/16/2014  . Asthma, cough variant 08/16/2014  . PMDD (premenstrual dysphoric disorder) 08/16/2014  . Anxiety and depression 08/16/2014  . Gastroesophageal reflux disease without esophagitis 08/16/2014  . Hyperlipidemia 08/16/2014    SJeral PinchPT 12/31/2015, 4:16 PM  [facility] [address]Phone: [phone]  Fax:  [phone] Name: MClaryce FrielMRN: [id number] of Birth: 107/11/1985

## 2016-01-03 ENCOUNTER — Ambulatory Visit (INDEPENDENT_AMBULATORY_CARE_PROVIDER_SITE_OTHER): Payer: BLUE CROSS/BLUE SHIELD | Admitting: Physical Therapy

## 2016-01-03 DIAGNOSIS — R293 Abnormal posture: Secondary | ICD-10-CM

## 2016-01-03 DIAGNOSIS — R29898 Other symptoms and signs involving the musculoskeletal system: Secondary | ICD-10-CM | POA: Diagnosis not present

## 2016-01-03 DIAGNOSIS — M6281 Muscle weakness (generalized): Secondary | ICD-10-CM | POA: Diagnosis not present

## 2016-01-03 NOTE — Therapy (Signed)
[place], [place], [address] Phone: [phone]   Fax:  [phone]  Physical Therapy Treatment  Patient Details  Name: Melissa Mcclain MRN: [id number] Date of Birth: 10-Mar-1986 Referring Provider: Dr Dianah Field  Encounter Date: 01/03/2016      PT End of Session - 01/03/16 1708    Visit Number 6   Number of Visits 12   Date for PT Re-Evaluation 01/30/16   PT Start Time 1706   PT Stop Time 6314   PT Time Calculation (min) 47 min   Activity Tolerance Patient tolerated treatment well      Past Medical History  Diagnosis Date  . Asthma   . Heart murmur     Past Surgical History  Procedure Laterality Date  . Tonsilectomy/adenoidectomy with myringotomy  1996  . Hernia repair  [id number]    There were no vitals filed for this visit.      Subjective Assessment - 01/03/16 1708    Subjective Pt states she is feeling alot better, thinks she is moving better too.    Patient Stated Goals strengthening shoulder blade so she can carry her baby    Currently in Pain? No/denies                         [facility] Adult PT Treatment/Exercise - 01/03/16 0001    Shoulder Exercises: Prone   Other Prone Exercises scap retrac in W, reaching overhead and back. attempted with upper body lifts however to hard.    Shoulder Exercises: Standing   Horizontal ABduction Strengthening;Both;20 reps;Theraband   Theraband Level (Shoulder Horizontal ABduction) Level 2 (Red)   External Rotation Both;Theraband  2x8   Theraband Level (Shoulder External Rotation) Level 2 (Red)  with noodle behind back   Other Standing Exercises bicep curls, leaning on noodle, 2x10, 3#   Shoulder Exercises: ROM/Strengthening   UBE (Upper Arm Bike) L1 x 3' BWD   Modalities   Modalities Electrical Stimulation;Moist Heat   Moist Heat Therapy   Number Minutes Moist Heat 15 Minutes   Moist Heat Location Shoulder   Electrical  Stimulation   Electrical Stimulation Location Rt infraspinatus/post shoulder   Electrical Stimulation Action IFC    Electrical Stimulation Parameters to tolerance   Electrical Stimulation Goals Tone;Pain   Manual Therapy   Manual Therapy Soft tissue mobilization   Soft tissue mobilization Rt infraspinatus trigger point, flatter than last week          Trigger Point Dry Needling - 01/03/16 1724    Consent Given? Yes   Education Handout Provided No   Muscles Treated Upper Body Infraspinatus  Rt   Infraspinatus Response Palpable increased muscle length;Twitch response elicited                   PT Long Term Goals - 12/31/15 1528    PT LONG TERM GOAL #1   Title Improve posture and alignment with improved position of scapula along the thoracic wall 01/30/16   Status On-going   PT LONG TERM GOAL #2   Title Increase AROM Rt shoulder to within 5-8 deg of AROM of Lt shoulder 01/30/16   Status Achieved   PT LONG TERM GOAL #3   Title Improve strength Rt [place] to 4+/5 to 5/5 throughout 01/30/16   Status Partially Met   PT LONG TERM GOAL #4   Title I in HEP 01/30/16   Status On-going   PT LONG TERM GOAL #  5   Title Improve FOTO to </= 29% limitation 01/30/16   Status On-going               Plan - 01/03/16 1751    Clinical Impression Statement Jacie tolerated TDN to the Rt infraspinatus with good twitch response.  She has noticed a signficant difference over the last week.  She continues to have weakness. She had a rash on her back and Rr arm, wondering if she has a reaction to our laundry detergent.  Not sure what it is from however she has noticed it after her treatments    Rehab Potential Good   PT Frequency 2x / week   PT Duration 6 weeks   PT Treatment/Interventions Patient/family education;ADLs/Self Care Home Management;Neuromuscular re-education;Manual techniques;Dry needling;Cryotherapy;Electrical Stimulation;Iontophoresis '4mg'$ /ml Dexamethasone;Moist  Heat;Ultrasound;Therapeutic activities;Therapeutic exercise   PT Next Visit Plan progress strengthening , continue combo vs TDN as needed.    Consulted and Agree with Plan of Care Patient      Patient will benefit from skilled therapeutic intervention in order to improve the following deficits and impairments:  Postural dysfunction, Improper body mechanics, Pain, Increased fascial restricitons, Decreased range of motion, Decreased strength, Decreased mobility, Decreased endurance, Decreased activity tolerance, Impaired UE functional use  Visit Diagnosis: Muscle weakness (generalized)  Other symptoms and signs involving the musculoskeletal system  Abnormal posture     Problem List Patient Active Problem List   Diagnosis Date Noted  . 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 Status post vacuum-assisted vaginal delivery 08/22/2015  . 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 IDA (iron deficiency anemia) 08/16/2014  . Seasonal allergies 08/16/2014  . Asthma, cough variant 08/16/2014  . PMDD (premenstrual dysphoric disorder) 08/16/2014  . Anxiety and depression 08/16/2014  . Gastroesophageal reflux disease without esophagitis 08/16/2014  . Hyperlipidemia 08/16/2014    Jeral Pinch PT 01/03/2016, 5:55 PM  [facility] [place], [place], [address] Phone: [phone]   Fax:  [phone]  Name: Melissa Mcclain MRN: [id number] Date of Birth: [birth year]

## 2016-01-08 ENCOUNTER — Ambulatory Visit (INDEPENDENT_AMBULATORY_CARE_PROVIDER_SITE_OTHER): Payer: BLUE CROSS/BLUE SHIELD | Admitting: Physical Therapy

## 2016-01-08 ENCOUNTER — Encounter: Payer: Self-pay | Admitting: Physical Therapy

## 2016-01-08 DIAGNOSIS — R29898 Other symptoms and signs involving the musculoskeletal system: Secondary | ICD-10-CM | POA: Diagnosis not present

## 2016-01-08 DIAGNOSIS — R293 Abnormal posture: Secondary | ICD-10-CM | POA: Diagnosis not present

## 2016-01-08 DIAGNOSIS — M6281 Muscle weakness (generalized): Secondary | ICD-10-CM | POA: Diagnosis not present

## 2016-01-08 NOTE — Therapy (Signed)
[place], [place], [address] Phone: [phone]   Fax:  [phone]  Physical Therapy Treatment  Patient Details  Name: Melissa Mcclain MRN: [id number] Date of Birth: 1986/07/19 Referring Provider: Dr Dianah Field  Encounter Date: 01/08/2016      PT End of Session - 01/08/16 0853    Visit Number 7   Number of Visits 12   Date for PT Re-Evaluation 01/30/16   PT Start Time 0851   PT Stop Time 0933   PT Time Calculation (min) 42 min   Activity Tolerance Patient tolerated treatment well      Past Medical History  Diagnosis Date  . Asthma   . Heart murmur     Past Surgical History  Procedure Laterality Date  . Tonsilectomy/adenoidectomy with myringotomy  1996  . Hernia repair  [id number]    There were no vitals filed for this visit.      Subjective Assessment - 01/08/16 0852    Subjective Wasn't as sore after this last session of needling.    Currently in Pain? No/denies                         [facility] Adult PT Treatment/Exercise - 01/08/16 0001    Shoulder Exercises: Prone   Retraction Strengthening;Both  2x8 T's palms down, thumbs up   Shoulder Exercises: Standing   Other Standing Exercises wall push ups 2x8   Shoulder Exercises: ROM/Strengthening   UBE (Upper Arm Bike) L2x3' BWD   Shoulder Exercises: Stretch   Other Shoulder Stretches IR, pec and flex stretches using strap in doorway   Modalities   Modalities Ultrasound   Ultrasound   Ultrasound Location Rt post shoulder, infraspinatus   Ultrasound Parameters combo, 100%   Ultrasound Goals Pain  tone   Manual Therapy   Manual Therapy Joint mobilization;Soft tissue mobilization   Joint Mobilization Rt scapula   Soft tissue mobilization Rt infraspinatus and posterior shoulder muscles                      PT Long Term Goals - 01/08/16 0853    PT LONG TERM GOAL #1   Title Improve posture and  alignment with improved position of scapula along the thoracic wall 01/30/16   Status Achieved   PT LONG TERM GOAL #2   Title Increase AROM Rt shoulder to within 5-8 deg of AROM of Lt shoulder 01/30/16   Status Achieved   PT LONG TERM GOAL #3   Title Improve strength Rt [place] to 4+/5 to 5/5 throughout 01/30/16   Status Partially Met   PT LONG TERM GOAL #4   Title I in HEP 01/30/16   Status On-going   PT LONG TERM GOAL #5   Title Improve FOTO to </= 29% limitation 01/30/16   Status On-going               Plan - 01/08/16 0935    Clinical Impression Statement Joellen [place] continues to have decreased pain in her Rt shoulder complex and decreased reports of numbness into her hand.  Her scapula is moving to soon with elevation of her arm due to tightness in the posterior shoudler.     Rehab Potential Good   PT Frequency 2x / week   PT Duration 6 weeks   PT Treatment/Interventions Patient/family education;ADLs/Self Care Home Management;Neuromuscular re-education;Manual techniques;Dry needling;Cryotherapy;Electrical Stimulation;Iontophoresis '4mg'$ /ml Dexamethasone;Moist Heat;Ultrasound;Therapeutic activities;Therapeutic exercise   PT Next Visit Plan Rt  scapular mobs, upper back ther ex      Patient will benefit from skilled therapeutic intervention in order to improve the following deficits and impairments:  Postural dysfunction, Improper body mechanics, Pain, Increased fascial restricitons, Decreased range of motion, Decreased strength, Decreased mobility, Decreased endurance, Decreased activity tolerance, Impaired UE functional use  Visit Diagnosis: Other symptoms and signs involving the musculoskeletal system  Abnormal posture  Muscle weakness (generalized)     Problem List Patient Active Problem List   Diagnosis Date Noted  . 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 Status post vacuum-assisted vaginal delivery 08/22/2015  . 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 IDA (iron deficiency  anemia) 08/16/2014  . Seasonal allergies 08/16/2014  . Asthma, cough variant 08/16/2014  . PMDD (premenstrual dysphoric disorder) 08/16/2014  . Anxiety and depression 08/16/2014  . Gastroesophageal reflux disease without esophagitis 08/16/2014  . Hyperlipidemia 08/16/2014    Jeral Pinch PT 01/08/2016, 9:37 AM  [facility] [place], [place], [address] Phone: [phone]   Fax:  [phone]  Name: Rhandi Despain MRN: [id number] Date of Birth: [birth year]

## 2016-01-10 ENCOUNTER — Ambulatory Visit (INDEPENDENT_AMBULATORY_CARE_PROVIDER_SITE_OTHER): Payer: BLUE CROSS/BLUE SHIELD | Admitting: Physical Therapy

## 2016-01-10 ENCOUNTER — Encounter: Payer: Self-pay | Admitting: Physical Therapy

## 2016-01-10 DIAGNOSIS — R29898 Other symptoms and signs involving the musculoskeletal system: Secondary | ICD-10-CM | POA: Diagnosis not present

## 2016-01-10 DIAGNOSIS — M6281 Muscle weakness (generalized): Secondary | ICD-10-CM | POA: Diagnosis not present

## 2016-01-10 DIAGNOSIS — R293 Abnormal posture: Secondary | ICD-10-CM

## 2016-01-10 NOTE — Patient Instructions (Signed)
Over Head Pull: Narrow Grip     [place] [phone]   On back, knees bent, feet flat, band across thighs, elbows straight but relaxed. Pull hands apart (start). Keeping elbows straight, bring arms up and over head, hands toward floor. Keep pull steady on band. Hold momentarily. Return slowly, keeping pull steady, back to start. Repeat _2-3x10__ times. Band color _red_____   Side Pull: Double Arm   On back, knees bent, feet flat. Arms perpendicular to body, shoulder level, elbows straight but relaxed. Pull arms out to sides, elbows straight. Resistance band comes across collarbones, hands toward floor. Hold momentarily. Slowly return to starting position. Repeat _2-3x10__ times. Band color __red___   Elmer Picker   On back, knees bent, feet flat, left hand on left hip, right hand above left. Pull right arm DIAGONALLY (hip to shoulder) across chest. Bring right arm along head toward floor. Hold momentarily. Slowly return to starting position. Repeat 2-3x10___ times. Do with left arm. Band color __red____   Shoulder Rotation: Double Arm   On back, knees bent, feet flat, elbows tucked at sides, bent 90, hands palms up. Pull hands apart and down toward floor, keeping elbows near sides. Hold momentarily. Slowly return to starting position. Repeat 2-3x10___ times. Band color _red_____

## 2016-01-10 NOTE — Therapy (Signed)
Melissa Mcclain [place], [place], [address] Phone: [phone]   Fax:  [phone]  Physical Therapy Treatment  Patient Details  Name: Melissa Mcclain MRN: [id number] Date of Birth: 10/23/1985 Referring Provider: Dr Melissa Mcclain  Encounter Date: 01/10/2016      PT End of Session - 01/10/16 0939    Visit Number 8   Number of Visits 12   Date for PT Re-Evaluation 01/30/16   PT Start Time 0935   PT Stop Time 1019   PT Time Calculation (min) 44 min   Activity Tolerance Patient tolerated treatment well      Past Medical History  Diagnosis Date  . Asthma   . Heart murmur     Past Surgical History  Procedure Laterality Date  . Tonsilectomy/adenoidectomy with myringotomy  1996  . Hernia repair  [id number]    There were no vitals filed for this visit.      Subjective Assessment - 01/10/16 0940    Subjective Still doing well, Will be home alone with the baby this weekend   Patient Stated Goals strengthening shoulder blade so she can carry her baby    Currently in Pain? No/denies            Melissa Mcclain PT Assessment - 01/10/16 0001    Assessment   Medical Diagnosis Winged Rt scapula                     [facility] Adult PT Treatment/Exercise - 01/10/16 0001    Shoulder Exercises: Supine   Horizontal ABduction Strengthening;Both;20 reps;Theraband   Theraband Level (Shoulder Horizontal ABduction) Level 2 (Red)   External Rotation Strengthening;Both;20 reps;Theraband   Theraband Level (Shoulder External Rotation) Level 2 (Red)   Flexion Strengthening;Both;20 reps;Theraband   Theraband Level (Shoulder Flexion) Level 2 (Red)   Other Supine Exercises D2 SASH red 2x10   Shoulder Exercises: ROM/Strengthening   UBE (Upper Arm Bike) L2x4' BWD   Shoulder Exercises: Body Blade   Flexion --  10 reps   Modalities   Modalities Ultrasound   Ultrasound   Ultrasound Location Rt post shoulder    Ultrasound  Parameters combo, 100%, 3.15mz 1.5w/cm2   Ultrasound Goals Pain                PT Education - 01/10/16 0949    Education provided Yes   Education Details HEP   Person(s) Educated Patient   Methods Demonstration;Handout;Explanation   Comprehension Returned demonstration             PT Long Term Goals - 01/08/16 0853    PT LONG TERM GOAL #1   Title Improve posture and alignment with improved position of scapula along the thoracic wall 01/30/16   Status Achieved   PT LONG TERM GOAL #2   Title Increase AROM Rt shoulder to within 5-8 deg of AROM of Lt shoulder 01/30/16   Status Achieved   PT LONG TERM GOAL #3   Title Improve strength Rt [place] 4+/5 to 5/5 throughout 01/30/16   Status Partially Met   PT LONG TERM GOAL #4   Title I in HEP 01/30/16   Status On-going   PT LONG TERM GOAL #5   Title Improve FOTO to </= 29% limitation 01/30/16   Status On-going               Plan - 01/10/16 1217    Clinical Impression Statement Melissa Mcclain's pain has been minimal to none, there is still a  small trigger point in the Lt infraspinatus. She sees the MD next week.    Rehab Potential Good   PT Frequency 2x / week   PT Duration 6 weeks   PT Treatment/Interventions Patient/family education;ADLs/Self Care Home Management;Neuromuscular re-education;Manual techniques;Dry needling;Cryotherapy;Electrical Stimulation;Iontophoresis 3m/ml Dexamethasone;Moist Heat;Ultrasound;Therapeutic activities;Therapeutic exercise   PT Next Visit Plan Rt scapular mobs, upper back ther ex      Patient will benefit from skilled therapeutic intervention in order to improve the following deficits and impairments:  Postural dysfunction, Improper body mechanics, Pain, Increased fascial restricitons, Decreased range of motion, Decreased strength, Decreased mobility, Decreased endurance, Decreased activity tolerance, Impaired UE functional use  Visit Diagnosis: Other symptoms and signs involving the  musculoskeletal system  Abnormal posture  Muscle weakness (generalized)     Problem List Patient Active Problem List   Diagnosis Date Noted  . 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 Status post vacuum-assisted vaginal delivery 08/22/2015  . 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 IDA (iron deficiency anemia) 08/16/2014  . Seasonal allergies 08/16/2014  . Asthma, cough variant 08/16/2014  . PMDD (premenstrual dysphoric disorder) 08/16/2014  . Anxiety and depression 08/16/2014  . Gastroesophageal reflux disease without esophagitis 08/16/2014  . Hyperlipidemia 08/16/2014    Melissa Mcclain 01/10/2016, 12:35 PM  Melissa Mcclain [id number]: [phone]  Fax:  [phone] Name: Melissa ClinkenbeardMRN: [id number] of Birth: [birth year]

## 2016-01-14 ENCOUNTER — Ambulatory Visit (INDEPENDENT_AMBULATORY_CARE_PROVIDER_SITE_OTHER): Payer: BLUE CROSS/BLUE SHIELD | Admitting: Physical Therapy

## 2016-01-14 ENCOUNTER — Ambulatory Visit (INDEPENDENT_AMBULATORY_CARE_PROVIDER_SITE_OTHER): Payer: BLUE CROSS/BLUE SHIELD | Admitting: Sports Medicine

## 2016-01-14 ENCOUNTER — Encounter: Payer: Self-pay | Admitting: Sports Medicine

## 2016-01-14 DIAGNOSIS — M958 Other specified acquired deformities of musculoskeletal system: Secondary | ICD-10-CM | POA: Diagnosis not present

## 2016-01-14 DIAGNOSIS — R29898 Other symptoms and signs involving the musculoskeletal system: Secondary | ICD-10-CM | POA: Diagnosis not present

## 2016-01-14 DIAGNOSIS — M6281 Muscle weakness (generalized): Secondary | ICD-10-CM

## 2016-01-14 DIAGNOSIS — R293 Abnormal posture: Secondary | ICD-10-CM

## 2016-01-14 NOTE — Progress Notes (Signed)
  Subjective:    CC: Follow-up  HPI: Wearing scapula: Right-sided, occurred after delivery of her baby and prolonged breast-feeding. We placed her through formal physical therapy after x-rays were negative, she's only had minimal if any improvement in her scapular winging. She has no pain, simply loss of function and range of motion. She also endorses mild paresthesias into the right upper arm and forearm bilaterally.  Past medical history, Surgical history, Family history not pertinant except as noted below, Social history, Allergies, and medications have been entered into the medical record, reviewed, and no changes needed.   Review of Systems: No fevers, chills, night sweats, weight loss, chest pain, or shortness of breath.   Objective:    General: Well Developed, well nourished, and in no acute distress.  Neuro: Alert and oriented x3, extra-ocular muscles intact, sensation grossly intact.  HEENT: Normocephalic, atraumatic, pupils equal round reactive to light, neck supple, no masses, no lymphadenopathy, thyroid nonpalpable.  Skin: Warm and dry, no rashes. Cardiac: Regular rate and rhythm, no murmurs rubs or gallops, no lower extremity edema.  Respiratory: Clear to auscultation bilaterally. Not using accessory muscles, speaking in full sentences. Right shoulder: Persistent scapular winging. Negative Hawkins, near, empty can sign, O'Brien's sign, cross arm sign. Neck: Negative spurling's Full neck range of motion Grip strength and sensation normal in bilateral hands Strength good C4 to T1 distribution No sensory change to C4 to T1 Reflexes normal  Impression and Recommendations:    I spent 25 minutes with this patient, greater than 50% was face-to-face time counseling regarding the above diagnoses

## 2016-01-14 NOTE — Therapy (Signed)
[place], [place], [address] Phone: [phone]   Fax:  [phone]  Physical Therapy Treatment  Patient Details  Name: Melissa Mcclain MRN: [id number] Date of Birth: 1986-06-01 Referring Provider: Dr Dianah Field  Encounter Date: 01/14/2016      PT End of Session - 01/14/16 0855    Visit Number 9   Number of Visits 12   Date for PT Re-Evaluation 01/30/16   PT Start Time 3734  pt arrived late   PT Stop Time 0928  pt had MD appt   PT Time Calculation (min) 36 min   Activity Tolerance Patient tolerated treatment well      Past Medical History  Diagnosis Date  . Asthma   . Heart murmur     Past Surgical History  Procedure Laterality Date  . Tonsilectomy/adenoidectomy with myringotomy  1996  . Hernia repair  [id number]    There were no vitals filed for this visit.      Subjective Assessment - 01/14/16 0903    Subjective Pt reports she doesn't have any pain, just tender to touch in Rt shoulder blade area.  Pt reports she is able to reach across her body a little easier, as is carrying her baby.  Pt feels the dry needling helped tremendously. She continues to have difficulty reaching overhead and keeping arm up while shampooing hair.    Currently in Pain? No/denies            [facility] PT Assessment - 01/14/16 0001    Strength   Right Shoulder Flexion 4+/5   Right Shoulder Extension --  5-/5   Right Shoulder ABduction --  5-/5   Right Shoulder Internal Rotation 5/5   Right Shoulder External Rotation --  5-/5                     [facility] Adult PT Treatment/Exercise - 01/14/16 0001    Shoulder Exercises: Supine   Horizontal ABduction Strengthening;Both;10 reps;Theraband   Theraband Level (Shoulder Horizontal ABduction) Level 3 (Green)   External Rotation Strengthening;Both;10 reps   Theraband Level (Shoulder External Rotation) Level 3 (Green)   Flexion  Strengthening;Both;20 reps;Theraband   Theraband Level (Shoulder Flexion) Level 2 (Red)   Other Supine Exercises D2 SASH red x10 reps, green x 10 reps    Modalities   Modalities Ultrasound;Engineer, maintenance (IT) Parameters to tolerance    Electrical Stimulation Goals Tone;Pain   Ultrasound   Ultrasound Location Rt rhomboid, infraspinatus   Ultrasound Parameters combo - 100%, 3.3 mHz, 1.2 w/cm2, 8 min    Ultrasound Goals Pain   Manual Therapy   Manual Therapy Scapular mobilization;Soft tissue mobilization   Manual therapy comments hooklying with pillow under pelvis.    Joint Mobilization Rt scapula   Soft tissue mobilization Rt infraspinatus and posterior shoulder muscles                      PT Long Term Goals - 01/08/16 0853    PT LONG TERM GOAL #1   Title Improve posture and alignment with improved position of scapula along the thoracic wall 01/30/16   Status Achieved   PT LONG TERM GOAL #2   Title Increase AROM Rt shoulder to within 5-8 deg of AROM of Lt shoulder 01/30/16   Status Achieved   PT LONG TERM  GOAL #3   Title Improve strength Rt [place] to 4+/5 to 5/5 throughout 01/30/16   Status Partially Met   PT LONG TERM GOAL #4   Title I in HEP 01/30/16   Status On-going   PT LONG TERM GOAL #5   Title Improve FOTO to </= 29% limitation 01/30/16   Status On-going               Plan - 01/14/16 0929    Clinical Impression Statement Pt demonstrated improved Rt shoulder strength.  Pt was point tender in Rt rhomboid, subscap, and infraspinatus with manual therapy and combo.  Pt progressing well towards goals.    Rehab Potential Good   PT Frequency 2x / week   PT Duration 6 weeks   PT Treatment/Interventions Patient/family education;ADLs/Self Care Home Management;Neuromuscular re-education;Manual techniques;Dry  needling;Cryotherapy;Electrical Stimulation;Iontophoresis '4mg'$ /ml Dexamethasone;Moist Heat;Ultrasound;Therapeutic activities;Therapeutic exercise   PT Next Visit Plan Add prone trap exercises, continue modalities and TDN as indicated.    Consulted and Agree with Plan of Care Patient      Patient will benefit from skilled therapeutic intervention in order to improve the following deficits and impairments:  Postural dysfunction, Improper body mechanics, Pain, Increased fascial restricitons, Decreased range of motion, Decreased strength, Decreased mobility, Decreased endurance, Decreased activity tolerance, Impaired UE functional use  Visit Diagnosis: Other symptoms and signs involving the musculoskeletal system  Abnormal posture  Muscle weakness (generalized)     Problem List Patient Active Problem List   Diagnosis Date Noted  . 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 Status post vacuum-assisted vaginal delivery 08/22/2015  . 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 IDA (iron deficiency anemia) 08/16/2014  . Seasonal allergies 08/16/2014  . Asthma, cough variant 08/16/2014  . PMDD (premenstrual dysphoric disorder) 08/16/2014  . Anxiety and depression 08/16/2014  . Gastroesophageal reflux disease without esophagitis 08/16/2014  . Hyperlipidemia 08/16/2014   Kerin Perna, PTA 01/14/2016 9:31 AM  [facility] [place], [place], [address] Phone: [phone]   Fax:  [phone]  Name: Melissa Mcclain MRN: [id number] Date of Birth: Dec 19, 1985

## 2016-01-14 NOTE — Assessment & Plan Note (Signed)
Persistence of scapular winging despite aggressive formal physical therapy.  X-rays were negative. Symptoms occurred after nursing her baby. Principal she ran out early fatigability with overhead activities, a bit of numbness and tingling into the upper and lower lateral arm, very minimal pain. At this point we are going to proceed with MRI of the cervical spine and the shoulder with extension medial to the scapula. Return to go over MRI results

## 2016-01-17 ENCOUNTER — Encounter: Payer: BLUE CROSS/BLUE SHIELD | Admitting: Physical Therapy

## 2016-01-18 ENCOUNTER — Ambulatory Visit (INDEPENDENT_AMBULATORY_CARE_PROVIDER_SITE_OTHER): Payer: BLUE CROSS/BLUE SHIELD | Admitting: Physical Therapy

## 2016-01-18 DIAGNOSIS — M6281 Muscle weakness (generalized): Secondary | ICD-10-CM

## 2016-01-18 DIAGNOSIS — R29898 Other symptoms and signs involving the musculoskeletal system: Secondary | ICD-10-CM

## 2016-01-18 DIAGNOSIS — R293 Abnormal posture: Secondary | ICD-10-CM

## 2016-01-18 NOTE — Therapy (Signed)
[place], [place], [address] Phone: [phone]   Fax:  [phone]  Physical Therapy Treatment  Patient Details  Name: Melissa Mcclain MRN: [id number] Date of Birth: Jan 21, 1986 Referring Provider: Dr. Helane Rima  Encounter Date: 01/18/2016      PT End of Session - 01/18/16 0940    Visit Number 10   Number of Visits 12   Date for PT Re-Evaluation 01/30/16   PT Start Time 5573  pt arrived late   PT Stop Time 0936   PT Time Calculation (min) 43 min   Activity Tolerance Patient tolerated treatment well;No increased pain      Past Medical History  Diagnosis Date  . Asthma   . Heart murmur     Past Surgical History  Procedure Laterality Date  . Tonsilectomy/adenoidectomy with myringotomy  1996  . Hernia repair  2202,5427    There were no vitals filed for this visit.          [facility] PT Assessment - 01/18/16 0001    Assessment   Medical Diagnosis Winged Rt scapula   Referring Provider Dr. Helane Rima   Onset Date/Surgical Date 10/02/15   Hand Dominance Right   Next MD Visit after MRI          [facility] Adult PT Treatment/Exercise - 01/18/16 0001    Shoulder Exercises: Supine   Flexion Right;10 reps;Weights   Shoulder Flexion Weight (lbs) 1#   Flexion Limitations Challenging, unable to complete full ROM with weight due to discomfort at end range, performed within tolerance    Other Supine Exercises D2 sash with red x 10 reps, 2 sets   Shoulder Exercises: Prone   Flexion Strengthening;Right;10 reps   Flexion Limitations tactile cues to improve scapular stabilization    Extension Right;10 reps;Weights   Extension Weight (lbs) 1   Horizontal ABduction 1 Right;10 reps  2 sets   Horizontal ABduction 1 Weight (lbs) 1  2nd set   Other Prone Exercises Rt arm row with scap retraction with 1# x 10 rep    Shoulder Exercises: Standing   Flexion Strengthening;Right  with scap  retraction pool noodle.    Flexion Limitations 3 reps, pt only able to lift to 90 deg, before discomfort in Rt shoulder. Switched to supine   Shoulder Exercises: ROM/Strengthening   UBE (Upper Arm Bike) L2: 2 min backward, 1 min forward.    Shoulder Exercises: Stretch   Other Shoulder Stretches doorway stretch mid level x 30 sec (discomfort in scapula, stopped)   Manual Therapy   Soft tissue mobilization Edge tool assistance to Rt upper trap, levator, posterior shoulder, lat - to decrease fascial restrictions and decrease pain.    Kinesiotex --  Rock tape to [employer] and infraspinatus to decompress area                PT Education - 01/18/16 1056    Education provided Yes   Education Details HEP - added prone row, ext, horz abd, and flexion    Person(s) Educated Patient   Methods Explanation;Demonstration;Handout   Comprehension Verbalized understanding;Returned demonstration             PT Long Term Goals - 01/08/16 0853    PT LONG TERM GOAL #1   Title Improve posture and alignment with improved position of scapula along the thoracic wall 01/30/16   Status Achieved   PT LONG TERM GOAL #2   Title Increase AROM Rt shoulder to within 5-8 deg  of AROM of Lt shoulder 01/30/16   Status Achieved   PT LONG TERM GOAL #3   Title Improve strength Rt [place] to 4+/5 to 5/5 throughout 01/30/16   Status Partially Met   PT LONG TERM GOAL #4   Title I in HEP 01/30/16   Status On-going   PT LONG TERM GOAL #5   Title Improve FOTO to </= 29% limitation 01/30/16   Status On-going               Plan - 01/18/16 1053    Clinical Impression Statement Pt demonstrated difficulty tolerating standing shoulder/scap stabilization exercises.  Improved tolerance in prone and supine, as well as improved scapular stability with ther ex noted.   Noted less tenderness with manual work to Rt levator/rhomboid/ infraspinatus.  Pt making gradual progress towards remaining goals.    Rehab Potential  Good   PT Frequency 2x / week   PT Duration 6 weeks   PT Treatment/Interventions Patient/family education;ADLs/Self Care Home Management;Neuromuscular re-education;Manual techniques;Dry needling;Cryotherapy;Electrical Stimulation;Iontophoresis '4mg'$ /ml Dexamethasone;Moist Heat;Ultrasound;Therapeutic activities;Therapeutic exercise   PT Next Visit Plan assess response to prone trap exercises added to HEP, continue modalities and TDN as indicated.    Consulted and Agree with Plan of Care Patient      Patient will benefit from skilled therapeutic intervention in order to improve the following deficits and impairments:  Postural dysfunction, Improper body mechanics, Pain, Increased fascial restricitons, Decreased range of motion, Decreased strength, Decreased mobility, Decreased endurance, Decreased activity tolerance, Impaired UE functional use  Visit Diagnosis: Other symptoms and signs involving the musculoskeletal system  Abnormal posture  Muscle weakness (generalized)     Problem List Patient Active Problem List   Diagnosis Date Noted  . 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 Status post vacuum-assisted vaginal delivery 08/22/2015  . 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 IDA (iron deficiency anemia) 08/16/2014  . Seasonal allergies 08/16/2014  . Asthma, cough variant 08/16/2014  . PMDD (premenstrual dysphoric disorder) 08/16/2014  . Anxiety and depression 08/16/2014  . Gastroesophageal reflux disease without esophagitis 08/16/2014  . Hyperlipidemia 08/16/2014   Kerin Perna, PTA 01/18/2016 10:59 AM  [place], [place], [address] Phone: [phone]   Fax:  [phone]  Name: Riyanna Crutchley MRN: [id number] Date of Birth: 10-Jul-1986

## 2016-01-21 ENCOUNTER — Ambulatory Visit (INDEPENDENT_AMBULATORY_CARE_PROVIDER_SITE_OTHER): Payer: BLUE CROSS/BLUE SHIELD

## 2016-01-21 DIAGNOSIS — M7591 Shoulder lesion, unspecified, right shoulder: Secondary | ICD-10-CM

## 2016-01-21 DIAGNOSIS — M75101 Unspecified rotator cuff tear or rupture of right shoulder, not specified as traumatic: Secondary | ICD-10-CM | POA: Diagnosis not present

## 2016-01-21 DIAGNOSIS — M958 Other specified acquired deformities of musculoskeletal system: Secondary | ICD-10-CM

## 2016-01-21 DIAGNOSIS — R202 Paresthesia of skin: Secondary | ICD-10-CM | POA: Diagnosis not present

## 2016-01-23 ENCOUNTER — Encounter: Payer: BLUE CROSS/BLUE SHIELD | Admitting: Rehabilitative and Restorative Service Providers"

## 2016-01-23 ENCOUNTER — Encounter: Payer: Self-pay | Admitting: Sports Medicine

## 2016-01-23 ENCOUNTER — Ambulatory Visit (INDEPENDENT_AMBULATORY_CARE_PROVIDER_SITE_OTHER): Payer: BLUE CROSS/BLUE SHIELD | Admitting: Sports Medicine

## 2016-01-23 VITALS — BP 114/77 | HR 82 | Resp 16 | Wt 102.9 lb

## 2016-01-23 DIAGNOSIS — M958 Other specified acquired deformities of musculoskeletal system: Secondary | ICD-10-CM | POA: Diagnosis not present

## 2016-01-23 DIAGNOSIS — L300 Nummular dermatitis: Secondary | ICD-10-CM | POA: Diagnosis not present

## 2016-01-23 MED ORDER — TRIAMCINOLONE ACETONIDE 0.5 % EX OINT: 1.0000 "application " | TOPICAL_OINTMENT | Freq: Two times a day (BID) | CUTANEOUS | Status: DC

## 2016-01-23 NOTE — Addendum Note (Signed)
Addended by: Silverio Decamp on: 01/23/2016 10:06 AM   Modules accepted: Orders

## 2016-01-23 NOTE — Assessment & Plan Note (Signed)
Unfortunately with persistent winging of the right scapula after prolonged breast-feeding. Principal symptom however his discomfort with overhead activities, cervical spine MRI was negative, shoulder MRI showed rotator cuff tendinosis. Subacromial injection given under ultrasound guidance, continue physical therapy. Next step would be referral to neurology for consideration of nerve conduction testing.

## 2016-01-23 NOTE — Progress Notes (Addendum)
  Subjective:    CC: Follow-up MRI  HPI: This is a pleasant 30 year old female, she ended up with weaning scapula and right shoulder discomfort after prolonged breast-feeding. Cervical spine and shoulder MRIs were done, she failed over 6 weeks of physical therapy. Shoulder MRI showed rotator cuff tendinosis and principal symptom continues to be discomfort and early fatigability with overhead activities. Cervical spinal MRI was completely negative.  Skin rash: Present for a few weeks, itchy, localized in the arms and legs.  Past medical history, Surgical history, Family history not pertinant except as noted below, Social history, Allergies, and medications have been entered into the medical record, reviewed, and no changes needed.   Review of Systems: No fevers, chills, night sweats, weight loss, chest pain, or shortness of breath.   Objective:    General: Well Developed, well nourished, and in no acute distress.  Neuro: Alert and oriented x3, extra-ocular muscles intact, sensation grossly intact.  HEENT: Normocephalic, atraumatic, pupils equal round reactive to light, neck supple, no masses, no lymphadenopathy, thyroid nonpalpable.  Skin: Warm and dry, Several macules that are erythematous and scaling, excoriated slightly. Cardiac: Regular rate and rhythm, no murmurs rubs or gallops, no lower extremity edema.  Respiratory: Clear to auscultation bilaterally. Not using accessory muscles, speaking in full sentences.  Procedure: Real-time Ultrasound Guided Injection of right subacromial bursa Device: GE Logiq E  Verbal informed consent obtained.  Time-out conducted.  Noted no overlying erythema, induration, or other signs of local infection.  Skin prepped in a sterile fashion.  Local anesthesia: Topical Ethyl chloride.  With sterile technique and under real time ultrasound guidance:  25-gauge needle advanced into the bursa, 1 mL lidocaine, 1 mL Marcaine, 1 mL kenalog 40 injected  easily. Completed without difficulty  Pain immediately resolved suggesting accurate placement of the medication.  Advised to call if fevers/chills, erythema, induration, drainage, or persistent bleeding.  Images permanently stored and available for review in the ultrasound unit.  Impression: Technically successful ultrasound guided injection.  Impression and Recommendations:

## 2016-01-23 NOTE — Assessment & Plan Note (Signed)
Triamcinolone ointment 

## 2016-01-24 ENCOUNTER — Ambulatory Visit (INDEPENDENT_AMBULATORY_CARE_PROVIDER_SITE_OTHER): Payer: BLUE CROSS/BLUE SHIELD | Admitting: Physical Therapy

## 2016-01-24 DIAGNOSIS — R293 Abnormal posture: Secondary | ICD-10-CM | POA: Diagnosis not present

## 2016-01-24 DIAGNOSIS — R29898 Other symptoms and signs involving the musculoskeletal system: Secondary | ICD-10-CM | POA: Diagnosis not present

## 2016-01-24 DIAGNOSIS — M6281 Muscle weakness (generalized): Secondary | ICD-10-CM

## 2016-01-24 NOTE — Therapy (Signed)
[facility] [place] [facility] [address]  [address] [place], [place], [address] Phone: [phone]   Fax:  [phone]  Physical Therapy Treatment  Patient Details  Name: Melissa Mcclain MRN: [id number] Date of Birth: 03-30-1986 Referring Provider: Dr. Briant Sites  Encounter Date: 01/24/2016      PT End of Session - 01/24/16 1709    Visit Number 11   Number of Visits 12   Date for PT Re-Evaluation 01/30/16   PT Start Time 1704   PT Stop Time 1745   PT Time Calculation (min) 41 min   Activity Tolerance Patient limited by pain      Past Medical History  Diagnosis Date  . Asthma   . Heart murmur     Past Surgical History  Procedure Laterality Date  . Tonsilectomy/adenoidectomy with myringotomy  1996  . Hernia repair  [id number]    There were no vitals filed for this visit.      Subjective Assessment - 01/24/16 1709    Subjective Pt reports she received injection in her Rt shoulder yesterday. "It is difficult to move my (Rt) arm since then". Pt has been taking ibprofen to decrease pain.    Currently in Pain? Yes   Pain Score 3    Pain Location Shoulder   Pain Orientation Right   Pain Descriptors / Indicators Heaviness;Aching   Aggravating Factors  reaching overhead    Pain Relieving Factors heat, medicine.             [facility] PT Assessment - 01/24/16 0001    Assessment   Medical Diagnosis Winged Rt scapula   Referring Provider Dr. Briant Sites   Onset Date/Surgical Date 10/02/15   Hand Dominance Right   AROM   Right Shoulder Flexion 120 Degrees  in standing.    Right Shoulder ABduction 137 Degrees  in standing   Strength   Right Shoulder Flexion 4-/5  with pain    Right Shoulder Extension 5/5   Right Shoulder ABduction --  5-/5   Right Shoulder Internal Rotation 5/5   Right Shoulder External Rotation 5/5          [facility] Adult PT Treatment/Exercise - 01/24/16 0001    Shoulder Exercises: Pulleys   Flexion --  20  reps   ABduction --  20 reps - scaption   Shoulder Exercises: ROM/Strengthening   UBE (Upper Arm Bike) L1: 2 min backward, 1 min forward.    Emergency planning/management officer Rt rhomboid/ infraspinatus   Recruitment consultant Parameters to tolerance    Electrical Stimulation Goals Tone;Pain   Ultrasound   Ultrasound Location Rt rhomboid and infraspinatus    Ultrasound Parameters 100%, 3.3 mHz, 1/0 w/cm2, 8 min, Combo   Ultrasound Goals Pain   Manual Therapy   Manual Therapy Scapular mobilization;Myofascial release;Soft tissue mobilization   Manual therapy comments pt ticklish and guarded.    Soft tissue mobilization to Rt infraspinatus, trap, rhomboid, and thoracic paraspinals.    Scapular Mobilization in all directions, pin and stretch into flexion / abd (RUE) in Lt sidelying                      PT Long Term Goals - 01/08/16 0853    PT LONG TERM GOAL #1   Title Improve posture and alignment with improved position of scapula along the thoracic wall 01/30/16   Status Achieved   PT LONG TERM GOAL #2   Title Increase AROM  Rt shoulder to within 5-8 deg of AROM of Lt shoulder 01/30/16   Status Achieved   PT LONG TERM GOAL #3   Title Improve strength Rt [place] to 4+/5 to 5/5 throughout 01/30/16   Status Partially Met   PT LONG TERM GOAL #4   Title I in HEP 01/30/16   Status On-going   PT LONG TERM GOAL #5   Title Improve FOTO to </= 29% limitation 01/30/16   Status On-going               Plan - 01/24/16 1755    Clinical Impression Statement Pt had skin reaction to Rock tape after last application (appeared pink, pattern of adhesive). Pt hypersensitive (increased pain and ticklish at times) to soft tissue mobilizaiton to periscapular and rotator cuff. Pt demonstrated decreased Rt shoulder ROM this visit, with pain.  Shoulder strength was slightly improved in all motions except flexion.     Rehab Potential  Good   PT Frequency 2x / week   PT Duration 6 weeks   PT Treatment/Interventions Patient/family education;ADLs/Self Care Home Management;Neuromuscular re-education;Manual techniques;Dry needling;Cryotherapy;Electrical Stimulation;Iontophoresis '4mg'$ /ml Dexamethasone;Moist Heat;Ultrasound;Therapeutic activities;Therapeutic exercise   PT Next Visit Plan assess goals and need for additional therapy visits vs d/c to HEP.  TDN as indicated.    Consulted and Agree with Plan of Care Patient      Patient will benefit from skilled therapeutic intervention in order to improve the following deficits and impairments:  Postural dysfunction, Improper body mechanics, Pain, Increased fascial restricitons, Decreased range of motion, Decreased strength, Decreased mobility, Decreased endurance, Decreased activity tolerance, Impaired UE functional use  Visit Diagnosis: Other symptoms and signs involving the musculoskeletal system  Abnormal posture  Muscle weakness (generalized)     Problem List Patient Active Problem List   Diagnosis Date Noted  . 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 Status post vacuum-assisted vaginal delivery 08/22/2015  . 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 IDA (iron deficiency anemia) 08/16/2014  . Seasonal allergies 08/16/2014  . Asthma, cough variant 08/16/2014  . PMDD (premenstrual dysphoric disorder) 08/16/2014  . Anxiety and depression 08/16/2014  . Gastroesophageal reflux disease without esophagitis 08/16/2014  . Hyperlipidemia 08/16/2014   Kerin Perna, PTA 01/24/2016 6:06 PM  [place], [place], [address] Phone: [phone]   Fax:  [phone]  Name: Melissa Mcclain MRN: [id number] Date of Birth: 06-03-1986

## 2016-01-25 ENCOUNTER — Encounter: Payer: BLUE CROSS/BLUE SHIELD | Admitting: Rehabilitative and Restorative Service Providers"

## 2016-01-31 ENCOUNTER — Encounter: Payer: BLUE CROSS/BLUE SHIELD | Admitting: Rehabilitative and Restorative Service Providers"

## 2016-02-05 ENCOUNTER — Encounter: Payer: Self-pay | Admitting: Physical Therapy

## 2016-02-05 ENCOUNTER — Ambulatory Visit (INDEPENDENT_AMBULATORY_CARE_PROVIDER_SITE_OTHER): Payer: BLUE CROSS/BLUE SHIELD | Admitting: Physical Therapy

## 2016-02-05 DIAGNOSIS — R293 Abnormal posture: Secondary | ICD-10-CM

## 2016-02-05 DIAGNOSIS — M6281 Muscle weakness (generalized): Secondary | ICD-10-CM

## 2016-02-05 DIAGNOSIS — R29898 Other symptoms and signs involving the musculoskeletal system: Secondary | ICD-10-CM

## 2016-02-05 NOTE — Therapy (Signed)
[place], [place], [address] Phone: [phone]   Fax:  [phone]  Physical Therapy Treatment  Patient Details  Name: Melissa Mcclain MRN: [id number] Date of Birth: Mar 15, 1986 Referring Provider: Dr Dianah Field  Encounter Date: 02/05/2016      PT End of Session - 02/05/16 0849    Visit Number 12   Number of Visits 12   PT Start Time 0849   PT Stop Time 0940   PT Time Calculation (min) 51 min   Activity Tolerance Patient tolerated treatment well      Past Medical History  Diagnosis Date  . Asthma   . Heart murmur     Past Surgical History  Procedure Laterality Date  . Tonsilectomy/adenoidectomy with myringotomy  1996  . Hernia repair  [id number]    There were no vitals filed for this visit.      Subjective Assessment - 02/05/16 0850    Subjective Pt reports she doesn't feel to bad.  She feels like she is about the same since she saw me last. She is concerned that she has some difficulty with Rt shoulder flexion and abduction.    Patient Stated Goals strengthening shoulder blade so she can carry her baby    Currently in Pain? No/denies            [facility] PT Assessment - 02/05/16 0001    Assessment   Medical Diagnosis Winged Rt scapula   Referring Provider Dr Dianah Field   Onset Date/Surgical Date 10/02/15   Hand Dominance Right   Observation/Other Assessments   Focus on Therapeutic Outcomes (FOTO)  35% limited   ROM / Strength   AROM / PROM / Strength AROM;Strength   AROM   Right Shoulder Flexion 178 Degrees  supine, standing 140   Right Shoulder ABduction 174 Degrees  supine, standing 135   Right Shoulder Internal Rotation --  to bra strap   Right Shoulder External Rotation --  WNL, with tightness.    Strength   Overall Strength Comments mid trap Rt 4/5, low 4+/5, Lt 4+/5   Right/Left Shoulder Right   Right Shoulder Flexion 4/5   Right Shoulder Extension 5/5    Right Shoulder ABduction --  5-/5   Right Shoulder Internal Rotation 5/5   Right Shoulder External Rotation 5/5                     [facility] Adult PT Treatment/Exercise - 02/05/16 0001    Shoulder Exercises: Seated   Other Seated Exercises 10 reps each full can, bilat, single, alternating.    Shoulder Exercises: Standing   Flexion Strengthening;Right;Theraband  3x8   Theraband Level (Shoulder Flexion) Level 1 (Yellow)  attempted red, to hard   Other Standing Exercises attempted PNF D1 flexion with yellow band to hard    Other Standing Exercises wall push up position, transition elbows to/from hands 2x5 each side.    Shoulder Exercises: Pulleys   Flexion --  10 reps   Shoulder Exercises: ROM/Strengthening   UBE (Upper Arm Bike) L1: 2 min backward, 1 min forward.    Modalities   Modalities Electrical Stimulation;Moist Heat   Moist Heat Therapy   Number Minutes Moist Heat 15 Minutes   Moist Heat Location Shoulder  Rt    Electrical Stimulation   Electrical Stimulation Location Rt rhomboid/ infraspinatus   Electrical Stimulation Action IFC   Electrical Stimulation Parameters  to tolerance   Electrical Stimulation Goals Pain  PT Education - 02/05/16 0908    Education provided Yes   Education Details HEP Rt shoulder Strengthening.             PT Long Term Goals - 02/05/16 7681    PT LONG TERM GOAL #1   Title Improve posture and alignment with improved position of scapula along the thoracic wall 01/30/16   Status Achieved   PT LONG TERM GOAL #2   Title Increase AROM Rt shoulder to within 5-8 deg of AROM of Lt shoulder 01/30/16   Status Achieved   PT LONG TERM GOAL #3   Title Improve strength Rt [place] to 4+/5 to 5/5 throughout 01/30/16   Status Partially Met  see measurements above.    PT LONG TERM GOAL #4   Title I in HEP 01/30/16   Status Achieved   PT LONG TERM GOAL #5   Title Improve FOTO to </= 29% limitation 01/30/16   Status Not Met   scored 35% limted.                Plan - 02/05/16 0927    Clinical Impression Statement Melissa Mcclain has done well with PT, her pain is pretty much gone.  She has full shoulder ROM however does have weakness in the Rt shoulder complex. She has met most of her goals and progressing to the other ones. She doesn't need skilled PT any more as she can perform her HEP and should continue to get stronger.    PT Next Visit Plan D/C to HEP       Patient will benefit from skilled therapeutic intervention in order to improve the following deficits and impairments:     Visit Diagnosis: Other symptoms and signs involving the musculoskeletal system  Abnormal posture  Muscle weakness (generalized)     Problem List Patient Active Problem List   Diagnosis Date Noted  . 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 Status post vacuum-assisted vaginal delivery 08/22/2015  . 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 IDA (iron deficiency anemia) 08/16/2014  . Seasonal allergies 08/16/2014  . Asthma, cough variant 08/16/2014  . PMDD (premenstrual dysphoric disorder) 08/16/2014  . Anxiety and depression 08/16/2014  . Gastroesophageal reflux disease without esophagitis 08/16/2014  . Hyperlipidemia 08/16/2014    Jeral Pinch PT  02/05/2016, 12:03 PM  [facility] [place], [place], [address] Phone: [phone]   Fax:  [phone]  Name: Melissa Mcclain MRN: [id number] Date of Birth: 12/18/1985    PHYSICAL THERAPY DISCHARGE SUMMARY  Visits from Start of Care: 12  Current functional level related to goals / functional outcomes: See above   Remaining deficits: Rt shoulder weakness, should improve as patient continues with HEP   Education / Equipment: HEP , therabands.  Plan: Patient agrees to discharge.  Patient goals were partially met. Patient is being discharged due to being pleased with the  current functional level.  ?????    Jeral Pinch, PT 02/05/2016 12:04 PM

## 2016-02-05 NOTE — Patient Instructions (Signed)
Strengthening: Resisted Flexion  [place] [phone]    Hold tubing with left arm at side. Pull forward and up. Move shoulder through pain-free range of motion. Repeat __8-10__ times per set. Do __3__ sets per session. Do __1__ sessions per day.  Strengthening: Resisted Diagonal    Hold tubing with right arm down across body, thumb pointing back. Pull arm up and out, rotating arm to palm forward. Repeat __8-10__ times per set. Do _3___ sets per session. Do __1__ sessions per day.  Copyright  VHI. All rights reserved.

## 2016-02-14 ENCOUNTER — Ambulatory Visit (INDEPENDENT_AMBULATORY_CARE_PROVIDER_SITE_OTHER): Payer: BLUE CROSS/BLUE SHIELD | Admitting: Sports Medicine

## 2016-02-14 ENCOUNTER — Encounter: Payer: Self-pay | Admitting: Sports Medicine

## 2016-02-14 DIAGNOSIS — M958 Other specified acquired deformities of musculoskeletal system: Secondary | ICD-10-CM | POA: Diagnosis not present

## 2016-02-14 MED ORDER — MELOXICAM 15 MG PO TABS: ORAL_TABLET | ORAL | Status: DC

## 2016-02-14 NOTE — Progress Notes (Signed)
  Subjective:    CC: Follow-up  HPI: Winged scapula: Persistence of shoulder discomfort with winging of the scapula as well as paresthesias down to the fourth and fifth fingers suggestive of a C8 radiculopathy.  Winged scapula would be suggestive of C5 C6 C7 radiculopathy.  At this point we will switch from ibuprofen to meloxicam.  Cervical spine and shoulder MRIs were negative with the exception of some rotator cuff tendinosis, subacromial injection did not provide any response at the last visit.  Past medical history, Surgical history, Family history not pertinant except as noted below, Social history, Allergies, and medications have been entered into the medical record, reviewed, and no changes needed.   Review of Systems: No fevers, chills, night sweats, weight loss, chest pain, or shortness of breath.   Objective:    General: Well Developed, well nourished, and in no acute distress.  Neuro: Alert and oriented x3, extra-ocular muscles intact, sensation grossly intact.  HEENT: Normocephalic, atraumatic, pupils equal round reactive to light, neck supple, no masses, no lymphadenopathy, thyroid nonpalpable.  Skin: Warm and dry, no rashes. Cardiac: Regular rate and rhythm, no murmurs rubs or gallops, no lower extremity edema.  Respiratory: Clear to auscultation bilaterally. Not using accessory muscles, speaking in full sentences. Right shoulder: Persistent protrusion of the right scapula.  Impression and Recommendations:    I spent 25 minutes with this patient, greater than 50% was face-to-face time counseling regarding the above diagnoses

## 2016-02-14 NOTE — Assessment & Plan Note (Signed)
Persistence of shoulder discomfort with winging of the scapula as well as paresthesias down to the fourth and fifth fingers suggestive of a C8 radiculopathy. Winged scapula would be suggestive of C5 C6 C7 radiculopathy. At this point we will switch from ibuprofen to meloxicam. Cervical spine and shoulder MRIs were negative with the exception of some rotator cuff tendinosis, subacromial injection did not provide any response at the last visit. Next step will be nerve conduction and a letter myography however we will hold off for 2 months, Stephana will contact me via email at that point before we proceed.

## 2016-07-30 IMAGING — MR MR CERVICAL SPINE W/O CM
5 series · 35 of 48 positions shown · non-contrast
Comparison: None.

CLINICAL DATA: Right wing scapula. Right upper and lower arm
paresthesias.

EXAM:
MRI CERVICAL SPINE WITHOUT CONTRAST
TECHNIQUE: Multiplanar, multisequence MR imaging of the cervical spine was
performed. No intravenous contrast was administered.

[Series 2: T2 · sagittal · 3.0mm · 0.56mm/px · 8 of 13 slices shown (1 of 2)]
[im 1/13]
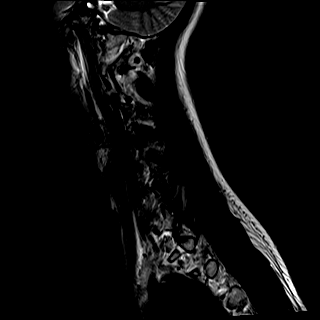
[im 2/13]
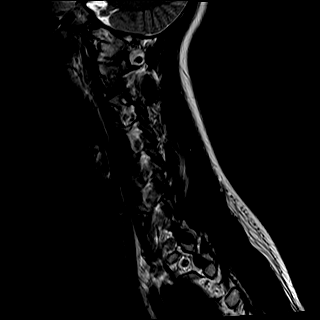
[im 4/13]
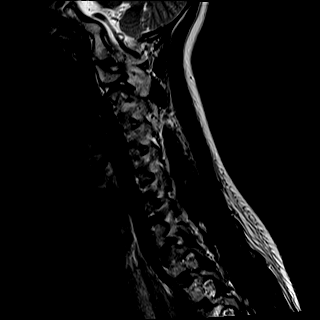
[im 6/13]
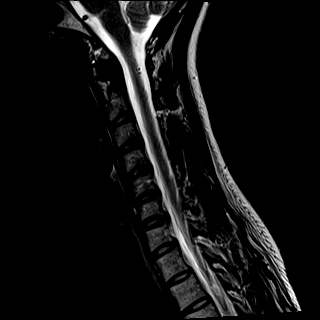
[im 7/13]
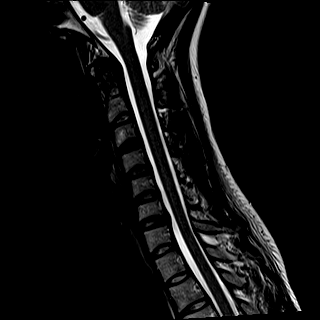
[im 9/13]
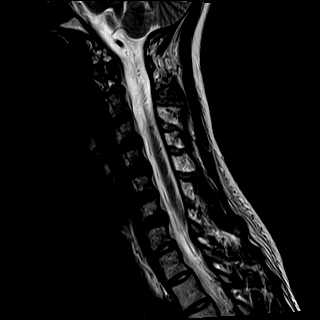
[im 11/13]
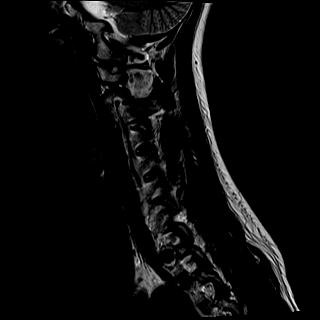
[im 13/13]
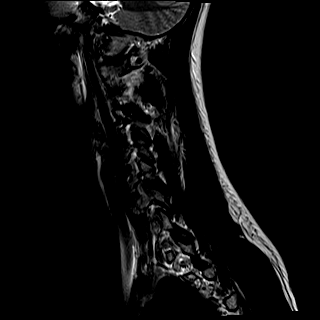

[Series 3: T1 · sagittal · 3.0mm · 0.70mm/px · 7 of 13 slices shown]
[im 1/13]
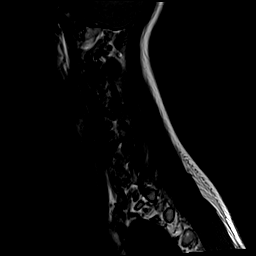
[im 3/13]
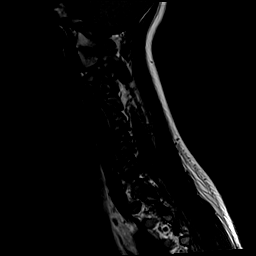
[im 5/13]
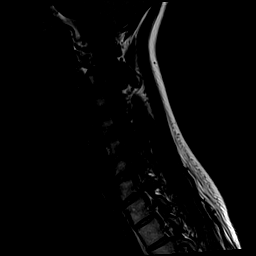
[im 7/13]
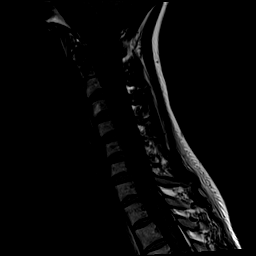
[im 9/13]
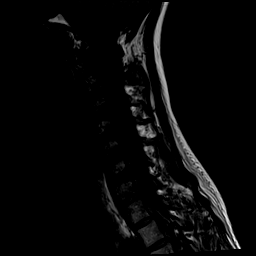
[im 11/13]
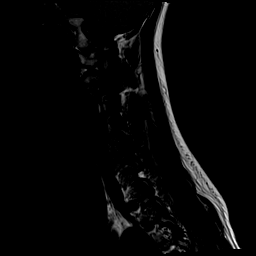
[im 13/13]
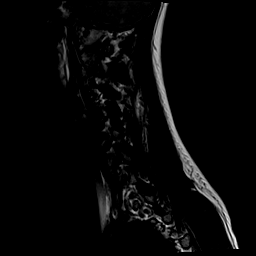

[Series 4: STIR · sagittal · 3.0mm · 0.35mm/px · 7 of 13 slices shown]
[im 1/13]
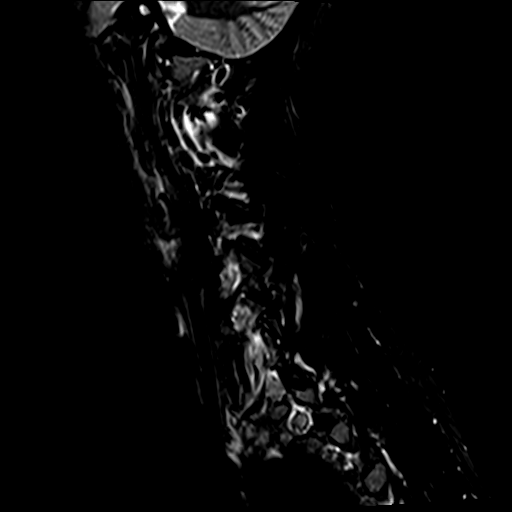
[im 3/13]
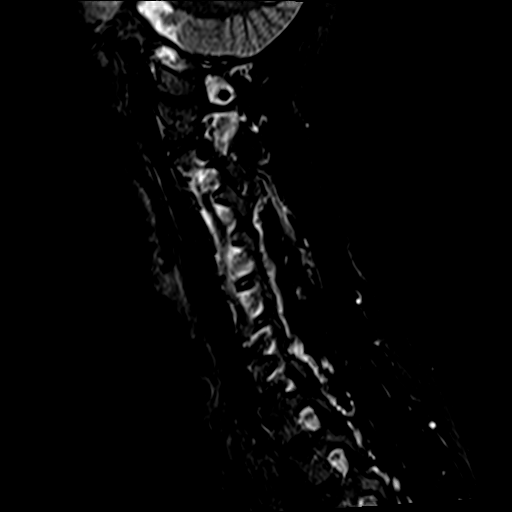
[im 5/13]
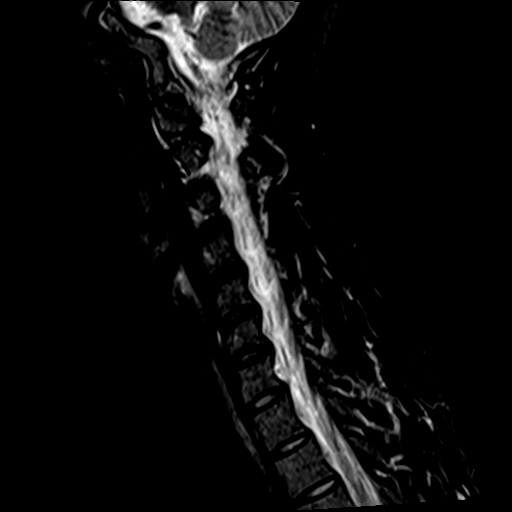
[im 7/13]
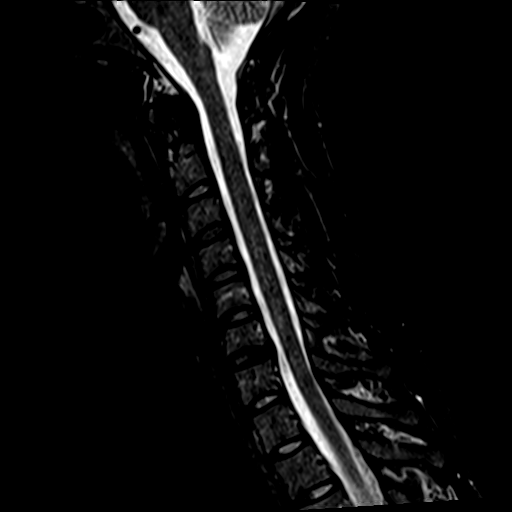
[im 9/13]
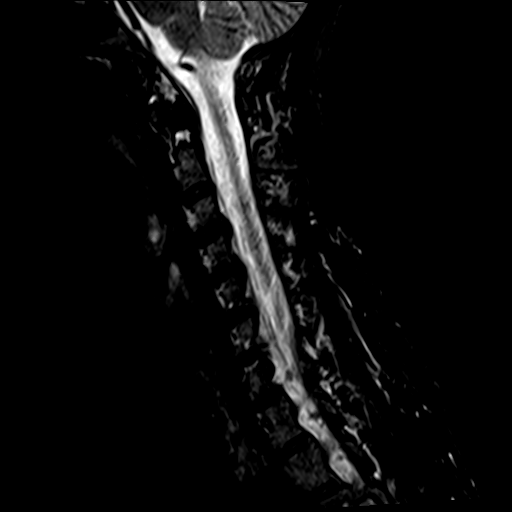
[im 11/13]
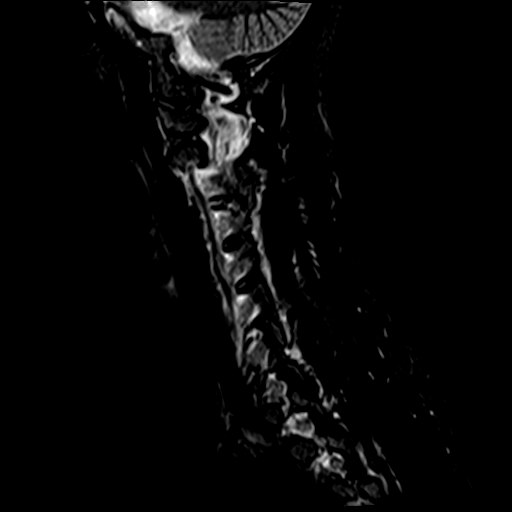
[im 13/13]
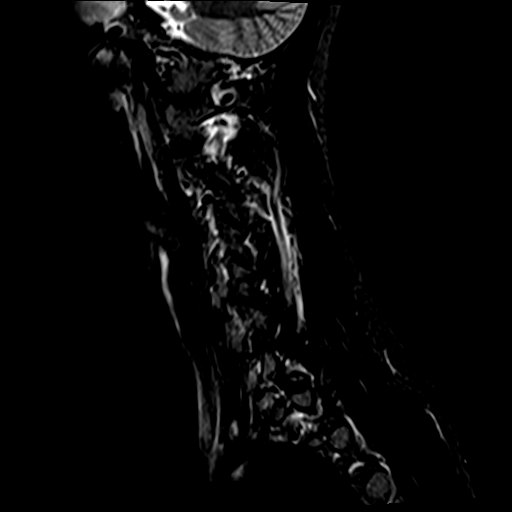

[Series 5: T2 · axial · 3.0mm · 0.62mm/px · z∈[-78,+9]mm · 9 of 25 slices shown (2 of 2)]
[im 1/25]
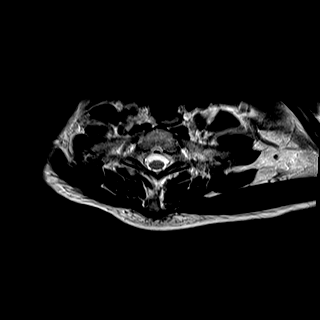
[im 5/25]
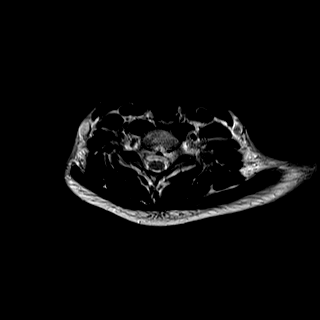
[im 9/25]
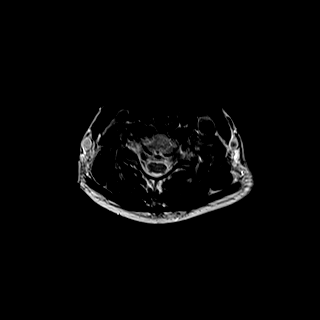
[im 11/25]
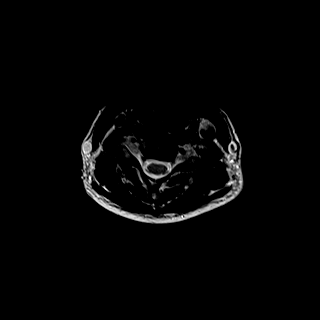
[im 13/25]
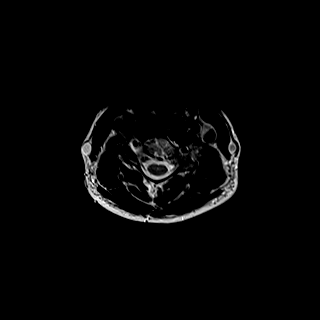
[im 15/25]
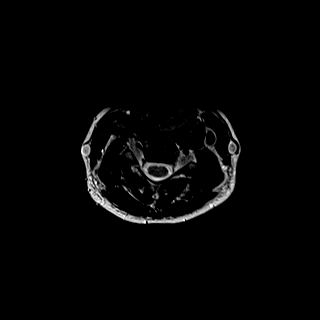
[im 17/25]
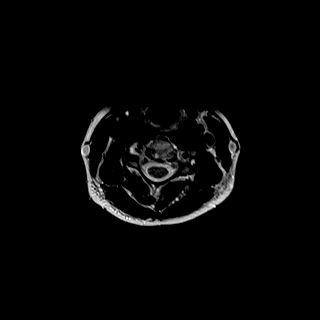
[im 21/25]
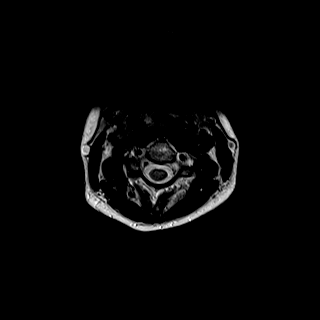
[im 25/25]
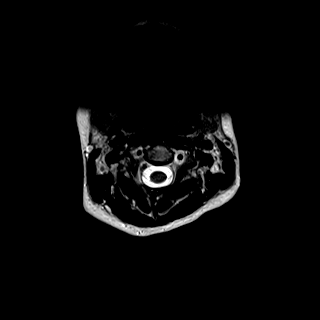

[Series 6: mpgr ax · axial · 3.0mm · 0.35mm/px · z∈[-65,-29]mm · 4 of 25 slices shown]
[im 1/25]
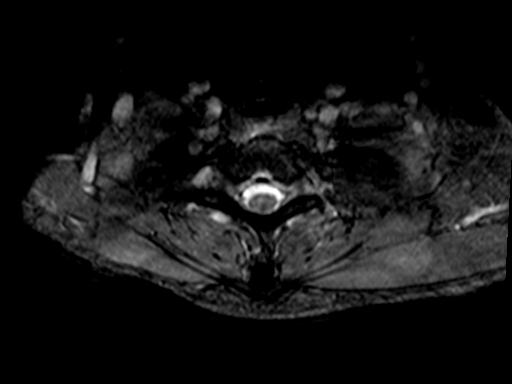
[im 5/25]
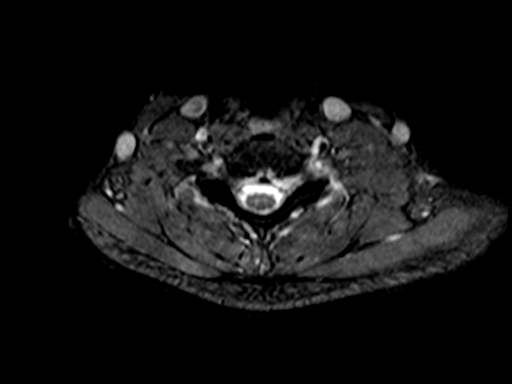
[im 9/25]
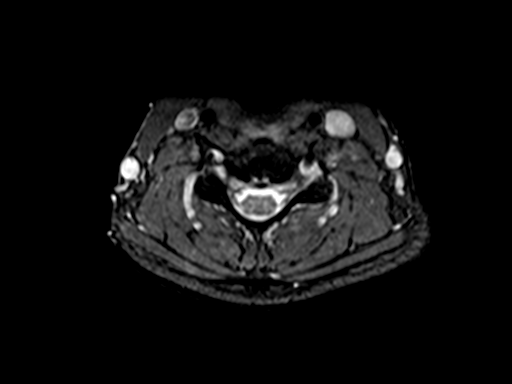
[im 11/25]
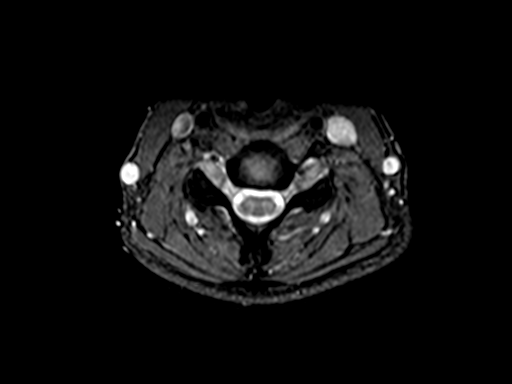

[35 of 48 positions shown; findings below may reference images not displayed]

FINDINGS: Alignment: Physiologic.

Vertebrae: No evidence for fracture, discitis, or bone lesion.

Cord: Normal signal and morphology

Posterior Fossa, vertebral arteries, paraspinal tissues: Negative
normal flow voids in the visible carotid and vertebral arteries.

Mild disc bulging at C6-7.  No herniation or impingement.
IMPRESSION: Negative.  No stenosis or impingement to explain right arm symptoms.

## 2016-08-07 DIAGNOSIS — H52223 Regular astigmatism, bilateral: Secondary | ICD-10-CM | POA: Diagnosis not present

## 2016-10-13 DIAGNOSIS — J301 Allergic rhinitis due to pollen: Secondary | ICD-10-CM | POA: Diagnosis not present

## 2016-10-13 DIAGNOSIS — J3089 Other allergic rhinitis: Secondary | ICD-10-CM | POA: Diagnosis not present

## 2016-10-13 DIAGNOSIS — M9903 Segmental and somatic dysfunction of lumbar region: Secondary | ICD-10-CM | POA: Diagnosis not present

## 2016-10-13 DIAGNOSIS — J45991 Cough variant asthma: Secondary | ICD-10-CM | POA: Diagnosis not present

## 2016-11-04 ENCOUNTER — Encounter: Payer: BLUE CROSS/BLUE SHIELD | Admitting: Physician Assistant

## 2016-11-04 DIAGNOSIS — J3089 Other allergic rhinitis: Secondary | ICD-10-CM | POA: Diagnosis not present

## 2016-11-04 DIAGNOSIS — J45991 Cough variant asthma: Secondary | ICD-10-CM | POA: Diagnosis not present

## 2016-11-04 DIAGNOSIS — K219 Gastro-esophageal reflux disease without esophagitis: Secondary | ICD-10-CM | POA: Diagnosis not present

## 2016-11-04 DIAGNOSIS — J301 Allergic rhinitis due to pollen: Secondary | ICD-10-CM | POA: Diagnosis not present

## 2016-12-31 ENCOUNTER — Encounter: Payer: BLUE CROSS/BLUE SHIELD | Admitting: Physician Assistant

## 2017-01-07 ENCOUNTER — Ambulatory Visit (INDEPENDENT_AMBULATORY_CARE_PROVIDER_SITE_OTHER): Payer: BLUE CROSS/BLUE SHIELD | Admitting: Physician Assistant

## 2017-01-07 ENCOUNTER — Encounter: Payer: Self-pay | Admitting: Physician Assistant

## 2017-01-07 VITALS — BP 104/69 | HR 88 | Ht 62.0 in | Wt 100.0 lb

## 2017-01-07 DIAGNOSIS — Z131 Encounter for screening for diabetes mellitus: Secondary | ICD-10-CM | POA: Diagnosis not present

## 2017-01-07 DIAGNOSIS — K580 Irritable bowel syndrome with diarrhea: Secondary | ICD-10-CM

## 2017-01-07 DIAGNOSIS — Z0000 Encounter for general adult medical examination without abnormal findings: Secondary | ICD-10-CM

## 2017-01-07 DIAGNOSIS — Z13220 Encounter for screening for lipoid disorders: Secondary | ICD-10-CM | POA: Diagnosis not present

## 2017-01-07 DIAGNOSIS — R5383 Other fatigue: Secondary | ICD-10-CM | POA: Diagnosis not present

## 2017-01-07 MED ORDER — DICYCLOMINE HCL 10 MG PO CAPS: 10.0000 mg | ORAL_CAPSULE | Freq: Three times a day (TID) | ORAL | 0 refills | Status: DC

## 2017-01-07 NOTE — Progress Notes (Signed)
Subjective:     Melissa Mcclain is a 31 y.o. female and is here for a comprehensive physical exam. The patient reports problems - pt does not have a lot of energy. she has a 83 month old son at home. she struggles to come hom for work and play. she sleeps well at night and wakes up rested for a while. she denies any depression.  pt also has some abdominal cramping after eating. Relieved with bowel movement. More loose stools than formed stools. She has definite food triggers.  dis  Social History   Social History  . Marital status: Married    Spouse name: N/A  . Number of children: N/A  . Years of education: N/A   Occupational History  . Not on file.   Social History Main Topics  . Smoking status: Never Smoker  . Smokeless tobacco: Never Used  . Alcohol use 0.0 oz/week  . Drug use: No  . Sexual activity: Yes    Birth control/ protection: None   Other Topics Concern  . Not on file   Social History Narrative  . No narrative on file   Health Maintenance  Topic Date Due  . INFLUENZA VACCINE  04/01/2017  . PAP SMEAR  01/17/2018  . TETANUS/TDAP  05/20/2025  . HIV Screening  Completed    The following portions of the patient's history were reviewed and updated as appropriate: allergies, current medications, past family history, past medical history, past social history, past surgical history and problem list.  Review of Systems Pertinent items noted in HPI and remainder of comprehensive ROS otherwise negative.   Objective:    BP 104/69   Pulse 88   Ht 5\' 2"  (1.575 m)   Wt 100 lb (45.4 kg)   BMI 18.29 kg/m  General appearance: alert, cooperative and appears stated age Head: Normocephalic, without obvious abnormality, atraumatic Eyes: conjunctivae/corneas clear. PERRL, EOM's intact. Fundi benign. Ears: normal TM's and external ear canals both ears Nose: Nares normal. Septum midline. Mucosa normal. No drainage or sinus tenderness. Throat: lips, mucosa, and tongue normal;  teeth and gums normal Neck: no adenopathy, no carotid bruit, no JVD, supple, symmetrical, trachea midline and thyroid not enlarged, symmetric, no tenderness/mass/nodules Back: symmetric, no curvature. ROM normal. No CVA tenderness. Lungs: clear to auscultation bilaterally Heart: regular rate and rhythm, S1, S2 normal, no murmur, click, rub or gallop Abdomen: soft, non-tender; bowel sounds normal; no masses,  no organomegaly Extremities: extremities normal, atraumatic, no cyanosis or edema Pulses: 2+ and symmetric Skin: Skin color, texture, turgor normal. No rashes or lesions Lymph nodes: Cervical, supraclavicular, and axillary nodes normal. Neurologic: Alert and oriented X 3, normal strength and tone. Normal symmetric reflexes. Normal coordination and gait    Assessment:    Healthy female exam.      Plan:  Marland KitchenMarland KitchenMelissa was seen today for annual exam.  Diagnoses and all orders for this visit:  Routine physical examination -     Lipid panel -     CBC -     Comprehensive metabolic panel -     C-reactive protein -     Ferritin -     Sedimentation rate -     TSH -     Vitamin B12 -     Vit D  25 hydroxy (rtn osteoporosis monitoring)  Screening for lipid disorders -     Lipid panel  Screening for diabetes mellitus -     Comprehensive metabolic panel  No energy -  CBC -     Comprehensive metabolic panel -     C-reactive protein -     Ferritin -     Sedimentation rate -     TSH -     Vitamin B12 -     Vit D  25 hydroxy (rtn osteoporosis monitoring)  Irritable bowel syndrome with diarrhea -     dicyclomine (BENTYL) 10 MG capsule; Take 1 capsule (10 mg total) by mouth 4 (four) times daily -  before meals and at bedtime.   Pt sees GYN for pap and breast exam.  Metabolic panel ordered.  Discussed causes of fatigue. Follow up as needed. Encouraged good 8 hours of rest at night.    See After Visit Summary for Counseling Recommendations

## 2017-01-07 NOTE — Patient Instructions (Signed)

## 2017-01-09 DIAGNOSIS — R5383 Other fatigue: Secondary | ICD-10-CM | POA: Insufficient documentation

## 2017-01-09 DIAGNOSIS — K580 Irritable bowel syndrome with diarrhea: Secondary | ICD-10-CM | POA: Insufficient documentation

## 2017-06-10 ENCOUNTER — Ambulatory Visit: Payer: BLUE CROSS/BLUE SHIELD | Admitting: Family Medicine

## 2017-06-12 ENCOUNTER — Encounter: Payer: Self-pay | Admitting: Physician Assistant

## 2017-06-12 ENCOUNTER — Ambulatory Visit (INDEPENDENT_AMBULATORY_CARE_PROVIDER_SITE_OTHER): Payer: BLUE CROSS/BLUE SHIELD | Admitting: Physician Assistant

## 2017-06-12 VITALS — BP 102/70 | HR 84 | Temp 98.7°F | Wt 102.0 lb

## 2017-06-12 DIAGNOSIS — R350 Frequency of micturition: Secondary | ICD-10-CM | POA: Diagnosis not present

## 2017-06-12 DIAGNOSIS — M545 Low back pain, unspecified: Secondary | ICD-10-CM | POA: Insufficient documentation

## 2017-06-12 DIAGNOSIS — Z23 Encounter for immunization: Secondary | ICD-10-CM

## 2017-06-12 LAB — POCT URINALYSIS DIPSTICK
Bilirubin, UA: NEGATIVE
Glucose, UA: NEGATIVE
Ketones, UA: NEGATIVE
Leukocytes, UA: NEGATIVE
Nitrite, UA: NEGATIVE
Protein, UA: NEGATIVE
Spec Grav, UA: 1.02 (ref 1.010–1.025)
Urobilinogen, UA: 0.2 E.U./dL
pH, UA: 7 (ref 5.0–8.0)

## 2017-06-12 MED ORDER — MELOXICAM 15 MG PO TABS: 15.0000 mg | ORAL_TABLET | Freq: Every day | ORAL | 0 refills | Status: DC

## 2017-06-12 MED ORDER — CYCLOBENZAPRINE HCL 5 MG PO TABS: 5.0000 mg | ORAL_TABLET | Freq: Every evening | ORAL | 0 refills | Status: DC | PRN

## 2017-06-12 NOTE — Patient Instructions (Signed)
-   Meloxicam 1 tab daily with breakfast. No other OTC pain relievers except Tylenol - Flexeril at bedtime as needed for muscle spasm - Moist heat - TENS unit - rehab exercises daily - foam roller - formal PT if no improvement in 2 weeks

## 2017-06-12 NOTE — Progress Notes (Signed)
HPI:                                                                Melissa Mcclain is a 31 y.o. female who presents to [place]: [place] today for low back pain  Patient reports left-sided low back pain x 2 weeks. Pain is moderate persistent, gradually worsening. Describes it as dull and tight. No known injury or trauma. Denies constitutional symptoms, paresthesias, saddle numbness, bowel or bladder dysfunction. She has tried heat and TENS unit without much relief. She does feel like she has been urinating more frequently and was concerned about a possible UTI. Denies dysuria, hematuria, urgency, nausea, suprapubic pressure, flank pain.  Past Medical History:  Diagnosis Date  . Asthma   . Heart murmur    Past Surgical History:  Procedure Laterality Date  . HERNIA REPAIR  [id number]  . TONSILECTOMY/ADENOIDECTOMY WITH MYRINGOTOMY  1996   Social History  Substance Use Topics  . Smoking status: Never Smoker  . Smokeless tobacco: Never Used  . Alcohol use 0.0 oz/week   family history includes Alcohol abuse in her father, maternal aunt, maternal grandfather, maternal uncle, paternal aunt, and paternal aunt; Cancer in her maternal grandfather and paternal grandmother; Depression in her brother; Hyperlipidemia in her maternal grandmother and mother.  ROS: negative except as noted in the HPI  Medications: No current outpatient prescriptions on file.   No current facility-administered medications for this visit.    No Known Allergies     Objective:  BP 102/70   Pulse 84   Temp 98.7 F (37.1 C) (Oral)   Wt 102 lb (46.3 kg)   LMP 06/10/2017   BMI 18.66 kg/m  Gen:  alert, not ill-appearing, no distress, appropriate for age 89: head normocephalic without obvious abnormality, conjunctiva and cornea clear, trachea midline Pulm: Normal work of breathing, normal phonation Neuro: alert and oriented x 3, no tremor, sensation grossly  intact MSK: back atraumatic, no abnormal curvature, ROM normal, negative straight leg raise, no tenderness over the SI joint, strength intact; extremities atraumatic, normal gait and station Skin: intact, no rashes on exposed skin  Depression screen PHQ 2/9 01/07/2017  Decreased Interest 1  Down, Depressed, Hopeless 1  PHQ - 2 Score 2  Altered sleeping 0  Tired, decreased energy 1  Change in appetite 3  Feeling bad or failure about yourself  1  Trouble concentrating 1  Moving slowly or fidgety/restless 0  Suicidal thoughts 0  PHQ-9 Score 8     Results for orders placed or performed in visit on 06/12/17 (from the past 72 hour(s))  POCT Urinalysis Dipstick     Status: Abnormal   Collection Time: 06/12/17  2:59 PM  Result Value Ref Range   Color, UA yellow    Clarity, UA clear    Glucose, UA negative    Bilirubin, UA negative    Ketones, UA negative    Spec Grav, UA 1.020 1.010 - 1.025   Blood, UA large    pH, UA 7.0 5.0 - 8.0   Protein, UA negaitve    Urobilinogen, UA 0.2 0.2 or 1.0 E.U./dL   Nitrite, UA negative    Leukocytes, UA Negative Negative   No results found.    Assessment  and Plan: 31 y.o. female with   1. Acute left-sided low back pain without sciatica - no red flag symptoms. Suspect lumbar strain. Symptomatic management with anti-inflammatory, muscle relaxer, ice/heat, and rehab exercises - if no improvement in 2 weeks, referral to formal PT - meloxicam (MOBIC) 15 MG tablet; Take 1 tablet (15 mg total) by mouth daily.  Dispense: 30 tablet; Refill: 0 - cyclobenzaprine (FLEXERIL) 5 MG tablet; Take 1 tablet (5 mg total) by mouth at bedtime as needed for muscle spasms.  Dispense: 30 tablet; Refill: 0   2. Urine frequency - POCT Urinalysis Dipstick positive for large blood. Patient is currently menstruating. No nitrates or leuks. Low suspicion for cystitis or pyelonephritis - Urine Culture  3. Need for immunization against influenza - Flu Vaccine QUAD 36+ mos  IM  Patient education and anticipatory guidance given Patient agrees with treatment plan Follow-up as needed if symptoms worsen or fail to improve  Darlyne Russian PA-C

## 2017-06-13 LAB — URINE CULTURE
MICRO NUMBER:: 81140590
Result:: NO GROWTH
SPECIMEN QUALITY:: ADEQUATE

## 2017-06-14 NOTE — Progress Notes (Signed)
Urine culture negative. No evidence of bladder/kidney infection.

## 2017-06-30 DIAGNOSIS — J3089 Other allergic rhinitis: Secondary | ICD-10-CM | POA: Diagnosis not present

## 2017-06-30 DIAGNOSIS — K219 Gastro-esophageal reflux disease without esophagitis: Secondary | ICD-10-CM | POA: Diagnosis not present

## 2017-06-30 DIAGNOSIS — J45991 Cough variant asthma: Secondary | ICD-10-CM | POA: Diagnosis not present

## 2017-06-30 DIAGNOSIS — J301 Allergic rhinitis due to pollen: Secondary | ICD-10-CM | POA: Diagnosis not present

## 2017-07-21 ENCOUNTER — Ambulatory Visit: Payer: BLUE CROSS/BLUE SHIELD | Admitting: Physician Assistant

## 2017-07-21 ENCOUNTER — Encounter: Payer: Self-pay | Admitting: Physician Assistant

## 2017-07-21 VITALS — BP 99/61 | HR 88 | Temp 98.5°F | Ht 62.0 in | Wt 100.0 lb

## 2017-07-21 DIAGNOSIS — J40 Bronchitis, not specified as acute or chronic: Secondary | ICD-10-CM | POA: Diagnosis not present

## 2017-07-21 DIAGNOSIS — J040 Acute laryngitis: Secondary | ICD-10-CM

## 2017-07-21 DIAGNOSIS — J329 Chronic sinusitis, unspecified: Secondary | ICD-10-CM

## 2017-07-21 MED ORDER — PREDNISONE 50 MG PO TABS: ORAL_TABLET | ORAL | 0 refills | Status: DC

## 2017-07-21 MED ORDER — AZITHROMYCIN 250 MG PO TABS: ORAL_TABLET | ORAL | 0 refills | Status: DC

## 2017-07-21 NOTE — Patient Instructions (Signed)
Laryngitis Laryngitis is swelling (inflammation) of your vocal cords. This causes hoarseness, coughing, loss of voice, sore throat, or a dry throat. When your vocal cords are inflamed, your voice sounds different. Laryngitis can be temporary (acute) or long-term (chronic). Most cases of acute laryngitis improve with time. Chronic laryngitis is laryngitis that lasts for more than three weeks. Follow these instructions at home:  Drink enough fluid to keep your pee (urine) clear or pale yellow.  Breathe in moist air. Use a humidifier if you live in a dry climate.  Take medicines only as told by your doctor.  Do not smoke cigarettes or electronic cigarettes. If you need help quitting, ask your doctor.  Talk as little as possible. Also avoid whispering, which can cause vocal strain.  Write instead of talking. Do this until your voice is back to normal. Contact a doctor if:  You have a fever.  Your pain is worse.  You have trouble swallowing. Get help right away if:  You cough up blood.  You have trouble breathing. This information is not intended to replace advice given to you by your health care provider. Make sure you discuss any questions you have with your health care provider. Document Released: 08/07/2011 Document Revised: 01/24/2016 Document Reviewed: 01/31/2014 Elsevier Interactive Patient Education  2018 Elsevier Inc.  

## 2017-07-21 NOTE — Progress Notes (Signed)
Subjective:    Patient ID: Melissa Mcclain Mcclain, female    DOB: 08-09-1986, 31 y.o.   MRN: [id number]  HPI Patient is a 31 year old female with history of asthma and allergies who presents to the clinic complaining of loss of voice, sinus pressure, cough.  She started having upper respiratory symptoms about 2 weeks ago.  She felt like she was getting a little better with Tylenol Cold sinus severe and then she started getting worse about 1 week ago.  She saw her allergist which may some allergy changes but that has not helped.  She is using her inhaler more frequently.  Her cough is productive but sputum is mostly white and clear.  She denies any fever, chills, body aches.  She does have a sore throat.  She lost her voice this morning.  .. Active Ambulatory Problems    Diagnosis Date Noted  . IDA (iron deficiency anemia) 08/16/2014  . Seasonal allergies 08/16/2014  . Asthma, cough variant 08/16/2014  . PMDD (premenstrual dysphoric disorder) 08/16/2014  . Anxiety and depression 08/16/2014  . Gastroesophageal reflux disease without esophagitis 08/16/2014  . Hyperlipidemia 08/16/2014  . 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 Status post vacuum-assisted vaginal delivery 08/22/2015  . 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 No energy 01/09/2017  . Irritable bowel syndrome with diarrhea 01/09/2017  . Acute left-sided low back pain without sciatica 06/12/2017   Resolved Ambulatory Problems    Diagnosis Date Noted  . Pregnant 12/17/2014  . Supervision of normal first pregnancy 01/18/2015  . Scabies 03/12/2015  . Cerumen impaction 03/12/2015  . [redacted] weeks gestation of pregnancy   . Choroid plexus cyst of fetus   . Encounter for supervision of normal first pregnancy in second trimester   . Active labor at term 08/19/2015  . Obstetrical laceration, second degree 08/22/2015   Past Medical History:  Diagnosis Date  . Asthma   . Heart murmur       Review of Systems   All other systems reviewed and are negative.      Objective:   Physical Exam  Constitutional: She is oriented to person, place, and time. She appears well-developed and well-nourished.  HENT:  Head: Normocephalic and atraumatic.  Right Ear: External ear normal.  Left Ear: External ear normal.  TM's clear bilaterally.  Oropharynx erythematous without tonsil swelling or exudate. PND present.  Tenderness over maxillary sinuses to palpation.   Eyes: Conjunctivae are normal. Right eye exhibits no discharge. Left eye exhibits no discharge.  Neck: Normal range of motion. Neck supple.  Cardiovascular: Normal rate, regular rhythm and normal heart sounds.  Pulmonary/Chest: Effort normal and breath sounds normal. She has no wheezes. She has no rales. She exhibits no tenderness.  Lymphadenopathy:    She has cervical adenopathy.  Neurological: She is alert and oriented to person, place, and time.  Psychiatric: She has a normal mood and affect. Her behavior is normal.          Assessment & Plan:  Marland KitchenMarland KitchenMelissa was seen today for cough, sore throat and laryngitis.  Diagnoses and all orders for this visit:  Laryngitis -     predniSONE (DELTASONE) 50 MG tablet; Take one tablet for 5 days.  Sinobronchitis -     azithromycin (ZITHROMAX) 250 MG tablet; Take 2 tablets now and then one tablet for 4 days. -     predniSONE (DELTASONE) 50 MG tablet; Take one tablet for 5 days.   Written out of work for today  and tomorrow. Discussed conservative treatments with salt water gargles, honey cough drops, voice rest, and hydration.  Due to the duration of illness we will go ahead and treat with Z-Pak.  I printed off prednisone if laryngitis does not improve in the next 48 hours.  She also has history of asthma although I hear no wheezing today since this is a holiday weekend she could also use if she develops wheezing.  Consider over-the-counter Delsym for cough. Follow up as needed.

## 2017-10-07 DIAGNOSIS — J3089 Other allergic rhinitis: Secondary | ICD-10-CM | POA: Diagnosis not present

## 2017-10-07 DIAGNOSIS — J301 Allergic rhinitis due to pollen: Secondary | ICD-10-CM | POA: Diagnosis not present

## 2017-10-07 DIAGNOSIS — J45991 Cough variant asthma: Secondary | ICD-10-CM | POA: Diagnosis not present

## 2017-10-07 DIAGNOSIS — K219 Gastro-esophageal reflux disease without esophagitis: Secondary | ICD-10-CM | POA: Diagnosis not present

## 2017-10-07 DIAGNOSIS — J4520 Mild intermittent asthma, uncomplicated: Secondary | ICD-10-CM | POA: Diagnosis not present

## 2017-11-10 DIAGNOSIS — J45991 Cough variant asthma: Secondary | ICD-10-CM | POA: Diagnosis not present

## 2017-11-10 DIAGNOSIS — J3089 Other allergic rhinitis: Secondary | ICD-10-CM | POA: Diagnosis not present

## 2017-11-10 DIAGNOSIS — J301 Allergic rhinitis due to pollen: Secondary | ICD-10-CM | POA: Diagnosis not present

## 2017-11-13 DIAGNOSIS — R05 Cough: Secondary | ICD-10-CM | POA: Diagnosis not present

## 2017-11-13 DIAGNOSIS — J4520 Mild intermittent asthma, uncomplicated: Secondary | ICD-10-CM | POA: Diagnosis not present

## 2017-12-28 DIAGNOSIS — J383 Other diseases of vocal cords: Secondary | ICD-10-CM | POA: Diagnosis not present

## 2017-12-28 DIAGNOSIS — J385 Laryngeal spasm: Secondary | ICD-10-CM | POA: Diagnosis not present

## 2017-12-28 DIAGNOSIS — R490 Dysphonia: Secondary | ICD-10-CM | POA: Diagnosis not present

## 2017-12-28 DIAGNOSIS — J3801 Paralysis of vocal cords and larynx, unilateral: Secondary | ICD-10-CM | POA: Diagnosis not present

## 2017-12-28 DIAGNOSIS — R05 Cough: Secondary | ICD-10-CM | POA: Diagnosis not present

## 2017-12-28 DIAGNOSIS — R1310 Dysphagia, unspecified: Secondary | ICD-10-CM | POA: Diagnosis not present

## 2018-01-11 ENCOUNTER — Ambulatory Visit: Payer: BLUE CROSS/BLUE SHIELD | Admitting: Physician Assistant

## 2018-01-11 ENCOUNTER — Encounter: Payer: Self-pay | Admitting: Physician Assistant

## 2018-01-11 VITALS — BP 107/65 | HR 86 | Ht 62.01 in | Wt 102.0 lb

## 2018-01-11 DIAGNOSIS — F419 Anxiety disorder, unspecified: Secondary | ICD-10-CM

## 2018-01-11 DIAGNOSIS — F410 Panic disorder [episodic paroxysmal anxiety] without agoraphobia: Secondary | ICD-10-CM

## 2018-01-11 DIAGNOSIS — F3281 Premenstrual dysphoric disorder: Secondary | ICD-10-CM

## 2018-01-11 DIAGNOSIS — G479 Sleep disorder, unspecified: Secondary | ICD-10-CM

## 2018-01-11 DIAGNOSIS — F329 Major depressive disorder, single episode, unspecified: Secondary | ICD-10-CM | POA: Diagnosis not present

## 2018-01-11 DIAGNOSIS — F32A Depression, unspecified: Secondary | ICD-10-CM

## 2018-01-11 MED ORDER — HYDROXYZINE PAMOATE 25 MG PO CAPS: 25.0000 mg | ORAL_CAPSULE | Freq: Three times a day (TID) | ORAL | 0 refills | Status: DC

## 2018-01-11 MED ORDER — SERTRALINE HCL 50 MG PO TABS: 50.0000 mg | ORAL_TABLET | Freq: Every day | ORAL | 1 refills | Status: DC

## 2018-01-11 MED ORDER — TRAZODONE HCL 50 MG PO TABS: 50.0000 mg | ORAL_TABLET | Freq: Every day | ORAL | 1 refills | Status: DC

## 2018-01-11 NOTE — Progress Notes (Signed)
Subjective:    Patient ID: Melissa Mcclain, female    DOB: 10/26/1985, 32 y.o.   MRN: [id number]  HPI  Patient is a 32 year old female with a history of anxiety and depression who presents to the clinic to discuss mood.  She feels like her anxiety and depression have increased substantially.  She denies any suicidal or homicidal thoughts.  She has an almost 59-year-old son who she struggles with feeling like she should be with him and not working.  Her work hours extend to the evening and even some weekends.  She does walk daily.  She feels like her thoughts are very obsessive. At times she is even having panic symptoms. She has been on Zoloft in the past but has not been on in few years because she did not want to feel dependent on this drug.  She certainly feels like her symptoms are the worse about a week before her menstrual cycle.  She has a lot of premenstrual symptoms with bloating, cramping, fatigue.  At times her symptoms are almost unbearable.  She has been calling out of work 1 to 2 days a month.  Her job is requesting that she have FMLA paperwork filled out.  .. Active Ambulatory Problems    Diagnosis Date Noted  . IDA (iron deficiency anemia) 08/16/2014  . Seasonal allergies 08/16/2014  . Asthma, cough variant 08/16/2014  . PMDD (premenstrual dysphoric disorder) 08/16/2014  . Anxiety and depression 08/16/2014  . Gastroesophageal reflux disease without esophagitis 08/16/2014  . Hyperlipidemia 08/16/2014  . 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 Status post vacuum-assisted vaginal delivery 08/22/2015  . 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 No energy 01/09/2017  . Irritable bowel syndrome with diarrhea 01/09/2017  . Acute left-sided low back pain without sciatica 06/12/2017  . Panic attacks 01/12/2018   Resolved Ambulatory Problems    Diagnosis Date Noted  . Pregnant 12/17/2014  . Supervision of normal first pregnancy 01/18/2015  . Scabies  03/12/2015  . Cerumen impaction 03/12/2015  . [redacted] weeks gestation of pregnancy   . Choroid plexus cyst of fetus   . Encounter for supervision of normal first pregnancy in second trimester   . Active labor at term 08/19/2015  . Obstetrical laceration, second degree 08/22/2015   Past Medical History:  Diagnosis Date  . Asthma   . Heart murmur      Review of Systems  All other systems reviewed and are negative.      Objective:   Physical Exam  Constitutional: She is oriented to person, place, and time. She appears well-developed and well-nourished.  HENT:  Head: Normocephalic and atraumatic.  Right Ear: External ear normal.  Eyes: Pupils are equal, round, and reactive to light. Conjunctivae and EOM are normal.  Cardiovascular: Normal rate and regular rhythm.  Pulmonary/Chest: Effort normal and breath sounds normal.  Neurological: She is alert and oriented to person, place, and time.  Psychiatric: She has a normal mood and affect. Her behavior is normal.          Assessment & Plan:  Marland KitchenMarland KitchenMelissa was seen today for depression.  Diagnoses and all orders for this visit:  PMDD (premenstrual dysphoric disorder) -     sertraline (ZOLOFT) 50 MG tablet; Take 1 tablet (50 mg total) by mouth daily.  Anxiety and depression -     hydrOXYzine (VISTARIL) 25 MG capsule; Take 1 capsule (25 mg total) by mouth 3 (three) times daily. As needed -  sertraline (ZOLOFT) 50 MG tablet; Take 1 tablet (50 mg total) by mouth daily. -     Ambulatory referral to Psychology  Panic attacks -     hydrOXYzine (VISTARIL) 25 MG capsule; Take 1 capsule (25 mg total) by mouth 3 (three) times daily. As needed -     sertraline (ZOLOFT) 50 MG tablet; Take 1 tablet (50 mg total) by mouth daily. -     Ambulatory referral to Psychology  Trouble in sleeping -     traZODone (DESYREL) 50 MG tablet; Take 1 tablet (50 mg total) by mouth at bedtime. -     Ambulatory referral to Psychology   .Marland Kitchen Depression  screen [facility] 2/9 01/12/2018 01/11/2018 01/07/2017  Decreased Interest 3 3 1   Down, Depressed, Hopeless 3 3 1   PHQ - 2 Score 6 6 2   Altered sleeping 3 3 0  Tired, decreased energy 3 3 1   Change in appetite 3 3 3   Feeling bad or failure about yourself  2 2 1   Trouble concentrating 1 1 1   Moving slowly or fidgety/restless 1 1 0  Suicidal thoughts 0 0 0  PHQ-9 Score 19 19 8   Difficult doing work/chores Extremely dIfficult Extremely dIfficult -   .. GAD 7 : Generalized Anxiety Score 01/12/2018 01/11/2018  Nervous, Anxious, on Edge 3 3  Control/stop worrying 3 3  Worry too much - different things 1 1  Trouble relaxing 3 3  Restless 3 3  Easily annoyed or irritable 2 2  Afraid - awful might happen - 1  Total GAD 7 Score - 16  Anxiety Difficulty Extremely difficult Extremely difficult   Certainly feel like anxiety, depression, PMDD is causing her significant troubles.  I strongly recommend counseling to work through some of these issues.  Restarted Zoloft.  Vistaril given for as needed in the panic attack-like symptoms.  For trouble sleeping trazodone given.  She is aware she can use that as needed as well.  Follow-up in 4 to 6 weeks.  In lieu of all of her anxiety, depression, panic attacks I did fill out some FMLA paperwork for 1 to 3 days a month to miss.  Also completed a letter to dismiss her from jury duty.  Marland Kitchen.Spent 30 minutes with patient and greater than 50 percent of visit spent counseling patient regarding treatment plan.

## 2018-01-11 NOTE — Patient Instructions (Signed)
Premenstrual Syndrome Premenstrual syndrome (PMS) is a group of physical, emotional, and behavioral symptoms that affect women of childbearing age. PMS starts 1-2 weeks before the start of a woman's period and goes away a few days after the period starts. It often recurs in a predictable pattern. PMS can range from mild to severe. When it is severe, it is called premenstrual dysphoric disorder (PMDD). PMS can interfere in many ways with normal daily activities. What are the causes? The cause of this condition is not known, but it seems to be related to hormone changes that happen before menstruation. What are the signs or symptoms? Symptoms of this condition often happen every month. They go away completely after your period starts. Physical symptoms include:  Bloating.  Breast pain.  Headaches.  Extreme fatigue.  Backaches.  Swelling of the hands and feet.  Weight gain.  Hot flashes.  Emotional and behavioral symptoms include:  Mood swings.  Depression.  Angry outbursts.  Irritability.  Anxiety.  Crying spells.  Food cravings or appetite changes.  Changes in sexual desire.  Confusion.  Aggression.  Social withdrawal.  Poor concentration.  How is this diagnosed? This condition is diagnosed if symptoms of PMS:  Are present in the 5 days before your period starts.  End within 4 days after your period starts.  Happen at least 3 months in a row.  Interfere with some of your normal activities.  Other conditions that can cause some of these symptoms must be ruled out before PMS can be diagnosed. How is this treated? This condition may be treated by:  Maintaining a healthy lifestyle. This includes eating a balanced diet and exercising regularly.  Taking medicines. Medicines can help relieve symptoms such as cramps, aches, pains, headaches, and breast tenderness. Depending on the severity of the condition, your health care provider may  recommend: ? Over-the-counter pain medicines. ? Prescription medicines for PMDD.  Follow these instructions at home: Eating and drinking   Eat a well-balanced diet.  Avoid caffeine and alcohol.  Limit the amount of salt and salty foods you eat. This will help lessen bloating.  Drink enough fluid to keep your urine clear or pale yellow.  Take a multivitamin if told to by your health care provider. Lifestyle  Do not use any tobacco products, such as cigarettes, chewing tobacco, and e-cigarettes. If you need help quitting, ask your health care provider.  Exercise regularly as suggested by your health care provider.  Get enough sleep.  Practice relaxation techniques.  Limit stress. Other Instructions  For 2-3 months, write down your symptoms, their severity, and how long they last. This will help your health care provider choose the best treatment for you.  Take over-the-counter and prescription medicines only as told by your health care provider.  If you are using oral contraceptive pills, use them as told by your health care provider. This information is not intended to replace advice given to you by your health care provider. Make sure you discuss any questions you have with your health care provider. Document Released: 08/15/2000 Document Revised: 09/19/2015 Document Reviewed: 05/18/2015 Elsevier Interactive Patient Education  2018 [employer] Inc.  

## 2018-01-12 ENCOUNTER — Encounter: Payer: Self-pay | Admitting: Physician Assistant

## 2018-01-12 DIAGNOSIS — G479 Sleep disorder, unspecified: Secondary | ICD-10-CM | POA: Insufficient documentation

## 2018-01-12 DIAGNOSIS — F410 Panic disorder [episodic paroxysmal anxiety] without agoraphobia: Secondary | ICD-10-CM | POA: Insufficient documentation

## 2018-02-22 ENCOUNTER — Telehealth: Payer: Self-pay | Admitting: Physician Assistant

## 2018-02-22 NOTE — Telephone Encounter (Signed)
There is really no comparable medication that is not a controlled substance that runs the risk of dependence. I can send low dose klonapin for as needed usage but goal would be to use short term and only as needed. Are you ok with this. If so can send klonapin .5mg  I po bid PRN as needed for anxiety 45. NRF

## 2018-02-22 NOTE — Telephone Encounter (Signed)
Yes it is the Vistaril and please send different med to her pharmacy.

## 2018-02-22 NOTE — Telephone Encounter (Signed)
Patient calls and states that the anxiety med you sent to pharmacy for her is out of stock and needs something different sent to her pharmacy. KG LPN

## 2018-02-22 NOTE — Telephone Encounter (Signed)
Confirm she is speaking of vistaril or hydroxyzine then ask where she would like [place] to send next.

## 2018-02-23 NOTE — Telephone Encounter (Signed)
Called pt and LM to return call to office.KG LPN

## 2018-02-26 ENCOUNTER — Other Ambulatory Visit: Payer: Self-pay | Admitting: *Deleted

## 2018-02-26 MED ORDER — CLONAZEPAM 0.5 MG PO TABS: 0.5000 mg | ORAL_TABLET | Freq: Two times a day (BID) | ORAL | 0 refills | Status: DC | PRN

## 2018-02-26 NOTE — Telephone Encounter (Signed)
Med entered and sent to pharmacy. Pt notified [place] LPN

## 2018-03-01 ENCOUNTER — Ambulatory Visit (INDEPENDENT_AMBULATORY_CARE_PROVIDER_SITE_OTHER): Payer: BLUE CROSS/BLUE SHIELD | Admitting: Licensed Clinical Social Worker

## 2018-03-01 DIAGNOSIS — F410 Panic disorder [episodic paroxysmal anxiety] without agoraphobia: Secondary | ICD-10-CM

## 2018-03-01 DIAGNOSIS — F3281 Premenstrual dysphoric disorder: Secondary | ICD-10-CM

## 2018-03-01 DIAGNOSIS — F411 Generalized anxiety disorder: Secondary | ICD-10-CM

## 2018-03-01 DIAGNOSIS — F329 Major depressive disorder, single episode, unspecified: Secondary | ICD-10-CM

## 2018-03-01 DIAGNOSIS — F419 Anxiety disorder, unspecified: Secondary | ICD-10-CM

## 2018-03-01 DIAGNOSIS — F32A Depression, unspecified: Secondary | ICD-10-CM
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
## 2018-04-01 ENCOUNTER — Ambulatory Visit (HOSPITAL_COMMUNITY): Payer: Self-pay | Admitting: Licensed Clinical Social Worker

## 2018-09-21 ENCOUNTER — Ambulatory Visit: Payer: BLUE CROSS/BLUE SHIELD | Admitting: Family Medicine

## 2018-09-21 ENCOUNTER — Encounter: Payer: Self-pay | Admitting: Family Medicine

## 2018-09-21 VITALS — BP 106/87 | HR 102 | Ht 62.0 in | Wt 110.0 lb

## 2018-09-21 DIAGNOSIS — J45991 Cough variant asthma: Secondary | ICD-10-CM | POA: Diagnosis not present

## 2018-09-21 DIAGNOSIS — R058 Other specified cough: Secondary | ICD-10-CM

## 2018-09-21 DIAGNOSIS — R05 Cough: Secondary | ICD-10-CM

## 2018-09-21 DIAGNOSIS — R053 Chronic cough: Secondary | ICD-10-CM

## 2018-09-21 MED ORDER — OMEPRAZOLE 20 MG PO CPDR: 20.0000 mg | DELAYED_RELEASE_CAPSULE | Freq: Every day | ORAL | 3 refills | Status: DC

## 2018-09-21 MED ORDER — MONTELUKAST SODIUM 10 MG PO TABS: 10.0000 mg | ORAL_TABLET | Freq: Every day | ORAL | 3 refills | Status: DC

## 2018-09-21 MED ORDER — BENZONATATE 200 MG PO CAPS: 200.0000 mg | ORAL_CAPSULE | Freq: Two times a day (BID) | ORAL | 0 refills | Status: DC | PRN

## 2018-09-21 NOTE — Patient Instructions (Signed)
Add Allegra 24 hour to your regimen in the morning.  OK to take with breakfast.  Start the omeprazole in the morning about 30 min before breakfast.  Try the tessalon perles.

## 2018-09-21 NOTE — Progress Notes (Signed)
Acute Office Visit  Subjective:    Patient ID: Melissa Mcclain, female    DOB: [birth year], 33 y.o.   MRN: [id number]  Chief Complaint  Patient presents with  . Cough    x 1 mo pt reports that she had been sick using nasal spray,symbicort,and proair, denies f/s/c    HPI Patient is in today for cough for about the last 4 months.  She says for the last couple of years every year starting in late fall she gets a chronic cough.  She has been evaluated and worked up by an Horticulturist, commercial, pulmonologist, and examined and worked with a Architectural technologist..  Hx of asthma.  Has been using symbicort, albuterol and nasal spray.  She feels like the Symbicort and albuterol have not really been helping but she said she started using it immediately as soon as her cough started in the fall.  NO fever, sweats or chills.  She feels like the cough is originating in her throat and upper airway.  She has not really noticed any significant wheezing or shortness of breath.  She has had some persistent nasal congestion also for the last several months but says this is typical for what happens in the winter months.  She is tried multiple medications in the past and the only thing that seems to be somewhat helpful is the Singulair and she has been taking that nightly.  Says she is had a couple nights where she is coughed so hard she is actually vomited.  She denies any heartburn or reflux symptoms.  She has been using nasal saline and a [facility] nasal allergy spray.  And she has been running her humidifier at night.  She no longer sleeps with her pets in the bedroom.  She had allergy testing done a year or 2 ago and was negative for most of the common allergens.  Past Medical History:  Diagnosis Date  . Asthma   . Heart murmur     Past Surgical History:  Procedure Laterality Date  . HERNIA REPAIR  [id number]  . TONSILECTOMY/ADENOIDECTOMY WITH MYRINGOTOMY  1996    Family History  Problem Relation Age of Onset  .  Hyperlipidemia Mother   . Alcohol abuse Father   . Depression Brother   . Alcohol abuse Maternal Aunt   . Alcohol abuse Maternal Uncle   . Alcohol abuse Paternal Aunt   . Hyperlipidemia Maternal Grandmother   . Alcohol abuse Maternal Grandfather   . Cancer Maternal Grandfather   . Cancer Paternal Grandmother   . Alcohol abuse Paternal Aunt     Social History   Socioeconomic History  . Marital status: Married    Spouse name: Not on file  . Number of children: Not on file  . Years of education: Not on file  . Highest education level: Not on file  Occupational History  . Not on file  Social Needs  . Financial resource strain: Not on file  . Food insecurity:    Worry: Not on file    Inability: Not on file  . Transportation needs:    Medical: Not on file    Non-medical: Not on file  Tobacco Use  . Smoking status: Never Smoker  . Smokeless tobacco: Never Used  Substance and Sexual Activity  . Alcohol use: Yes    Alcohol/week: 0.0 standard drinks  . Drug use: No  . Sexual activity: Yes    Birth control/protection: None  Lifestyle  . Physical activity:  Days per week: Not on file    Minutes per session: Not on file  . Stress: Not on file  Relationships  . Social connections:    Talks on phone: Not on file    Gets together: Not on file    Attends religious service: Not on file    Active member of club or organization: Not on file    Attends meetings of clubs or organizations: Not on file    Relationship status: Not on file  . Intimate partner violence:    Fear of current or ex partner: Not on file    Emotionally abused: Not on file    Physically abused: Not on file    Forced sexual activity: Not on file  Other Topics Concern  . Not on file  Social History Narrative  . Not on file    Outpatient Medications Prior to Visit  Medication Sig Dispense Refill  . azelastine (ASTELIN) 0.1 % nasal spray     . budesonide-formoterol (SYMBICORT) 160-4.5 MCG/ACT inhaler  Inhale 2 puffs into the lungs 2 (two) times daily.    . sertraline (ZOLOFT) 50 MG tablet Take 1 tablet (50 mg total) by mouth daily. 30 tablet 1  . montelukast (SINGULAIR) 10 MG tablet Take 10 mg by mouth daily.  3  . clonazePAM (KLONOPIN) 0.5 MG tablet Take 1 tablet (0.5 mg total) by mouth 2 (two) times daily as needed for anxiety. 45 tablet 0  . hydrOXYzine (VISTARIL) 25 MG capsule Take 1 capsule (25 mg total) by mouth 3 (three) times daily. As needed 30 capsule 0  . traZODone (DESYREL) 50 MG tablet Take 1 tablet (50 mg total) by mouth at bedtime. 30 tablet 1   No facility-administered medications prior to visit.     Allergies  Allergen Reactions  . Cetirizine Other (See Comments)    Reaction:  Fatigue  fatigue    ROS     Objective:    Physical Exam  Constitutional: She is oriented to person, place, and time. She appears well-developed and well-nourished.  HENT:  Head: Normocephalic and atraumatic.  Right Ear: External ear normal.  Left Ear: External ear normal.  Nose: Nose normal.  Mouth/Throat: Oropharynx is clear and moist.  TMs and canals are clear.   Eyes: Pupils are equal, round, and reactive to light. Conjunctivae and EOM are normal.  Neck: Neck supple. No thyromegaly present.  Cardiovascular: Normal rate, regular rhythm and normal heart sounds.  Pulmonary/Chest: Effort normal and breath sounds normal. She has no wheezes.  Lymphadenopathy:    She has no cervical adenopathy.  Neurological: She is alert and oriented to person, place, and time.  Skin: Skin is warm and dry.  Psychiatric: She has a normal mood and affect.    BP 106/87   Pulse (!) 102   Ht 5\' 2"  (1.575 m)   Wt 110 lb (49.9 kg)   SpO2 97%   BMI 20.12 kg/m  Wt Readings from Last 3 Encounters:  09/21/18 110 lb (49.9 kg)  01/11/18 102 lb (46.3 kg)  07/21/17 100 lb (45.4 kg)    Health Maintenance Due  Topic Date Due  . PAP SMEAR-Modifier  01/17/2018    There are no preventive care reminders  to display for this patient.   Lab Results  Component Value Date   TSH 1.85 10/08/2015   Lab Results  Component Value Date   WBC 13.2 (H) 08/19/2015   HGB 11.4 (L) 08/19/2015   HCT 34.9 (L) 08/19/2015  MCV 94.3 08/19/2015   PLT 172 08/19/2015   Lab Results  Component Value Date   NA 136 12/15/2014   K 4.0 12/15/2014   CO2 22 12/15/2014   GLUCOSE 78 12/15/2014   BUN 12 12/15/2014   CREATININE 0.64 12/15/2014   BILITOT 0.5 12/15/2014   ALKPHOS 58 12/15/2014   AST 13 12/15/2014   ALT 9 12/15/2014   PROT 7.1 12/15/2014   ALBUMIN 4.7 12/15/2014   CALCIUM 9.8 12/15/2014   Lab Results  Component Value Date   CHOL 269 (H) 12/15/2014   Lab Results  Component Value Date   HDL 86 12/15/2014   Lab Results  Component Value Date   LDLCALC 171 (H) 12/15/2014   Lab Results  Component Value Date   TRIG 61 12/15/2014   Lab Results  Component Value Date   CHOLHDL 3.1 12/15/2014   No results found for: HGBA1C     Assessment & Plan:   Problem List Items Addressed This Visit      Respiratory   Asthma, cough variant   Relevant Medications   budesonide-formoterol (SYMBICORT) 160-4.5 MCG/ACT inhaler   montelukast (SINGULAIR) 10 MG tablet    Other Visit Diagnoses    Chronic cough    -  Primary   Relevant Medications   benzonatate (TESSALON) 200 MG capsule   omeprazole (PRILOSEC) 20 MG capsule   Upper airway cough syndrome       Relevant Medications   benzonatate (TESSALON) 200 MG capsule   omeprazole (PRILOSEC) 20 MG capsule     Chronic cough-suspect upper airway cough syndrome.  We discussed options including adding an antihistamine to her Singulair since she does feel like the Singulair is helpful.  We will add Allegra in the morning just to see if this makes a difference.  Stop though if she feels like it is overly drying to her throat and membranes.  Make sure drinking plenty water.  Encouraged her to try to suppress the cough is much as possible as repetitive  coughing can actually then increase more coughing.  We will try Tessalon Perles to see if this helps if so we will be happy to refill this.  In addition we discussed treating reflux.  Even though she does not have any noticeable heartburn symptoms I explained how reflux from coughing can actually aggravate this and cause irritation to the vocal cords so she is willing to try PPI as well.  Asthma, cough variant-I did give her a peak flow meter to use as well this will help better help her decide whether or not she actually needs to use her albuterol.  It sounds like she really does not get any significant relief after you so I suspect that it is probably not related to her asthma.   Meds ordered this encounter  Medications  . benzonatate (TESSALON) 200 MG capsule    Sig: Take 1 capsule (200 mg total) by mouth 2 (two) times daily as needed for cough.    Dispense:  20 capsule    Refill:  0  . omeprazole (PRILOSEC) 20 MG capsule    Sig: Take 1 capsule (20 mg total) by mouth daily.    Dispense:  30 capsule    Refill:  3  . montelukast (SINGULAIR) 10 MG tablet    Sig: Take 1 tablet (10 mg total) by mouth daily.    Dispense:  90 tablet    Refill:  3     Beatrice Lecher, MD

## 2018-09-29 ENCOUNTER — Other Ambulatory Visit: Payer: Self-pay | Admitting: Family Medicine

## 2018-09-29 DIAGNOSIS — R05 Cough: Secondary | ICD-10-CM

## 2018-09-29 DIAGNOSIS — R058 Other specified cough: Secondary | ICD-10-CM

## 2018-09-29 DIAGNOSIS — R053 Chronic cough: Secondary | ICD-10-CM

## 2018-10-19 ENCOUNTER — Other Ambulatory Visit: Payer: Self-pay | Admitting: Family Medicine

## 2018-10-19 DIAGNOSIS — R053 Chronic cough: Secondary | ICD-10-CM

## 2018-10-19 DIAGNOSIS — R058 Other specified cough: Secondary | ICD-10-CM

## 2018-10-19 DIAGNOSIS — R05 Cough: Secondary | ICD-10-CM

## 2018-10-20 ENCOUNTER — Telehealth: Payer: Self-pay | Admitting: Physician Assistant

## 2018-10-20 NOTE — Telephone Encounter (Signed)
Did you try symbicort? Any relief?   Did you start omeprazole? Any changes?   you seen allergy? Have you see ENT?

## 2018-10-20 NOTE — Telephone Encounter (Signed)
Pt called clinic stating her cough is not better. She was seen in office for eval a month ago, and has gotten a refill on the tessalon pearls since. She reports she feels this is allergy related as she is also having some nasal congestion. Taking OTC Allegra, Flonase, and tessalon pearls. No relief. Declined offer to come in for re-evaluation stating "over the last 10 years I've seen so many doctors and had so many scans but it never gets better."   Will route.

## 2018-10-21 NOTE — Telephone Encounter (Signed)
Left VM for Pt to return clinic call regarding additional questions.

## 2019-02-09 ENCOUNTER — Telehealth: Payer: Self-pay | Admitting: Neurology

## 2019-02-09 NOTE — Telephone Encounter (Signed)
Patient left vm stating she was exposed to someone with Covid on 01/27/2019. Her employer wanted her to call and find out if any additional instructions besides to isolate for 14 days. No symptoms.   LMOM for patient making her aware no other advise, just isolate 14 days. If she develops any symptoms she will need to isolate another 14 days after the start of symptoms. She was instructed to call back with any additional questions.

## 2019-04-01 ENCOUNTER — Other Ambulatory Visit: Payer: Self-pay | Admitting: Physician Assistant

## 2019-04-01 DIAGNOSIS — F3281 Premenstrual dysphoric disorder: Secondary | ICD-10-CM

## 2019-04-01 DIAGNOSIS — F410 Panic disorder [episodic paroxysmal anxiety] without agoraphobia: Secondary | ICD-10-CM

## 2019-04-01 DIAGNOSIS — F419 Anxiety disorder, unspecified: Secondary | ICD-10-CM

## 2019-04-01 DIAGNOSIS — F329 Major depressive disorder, single episode, unspecified: Secondary | ICD-10-CM

## 2019-04-01 DIAGNOSIS — F32A Depression, unspecified: Secondary | ICD-10-CM

## 2019-04-25 ENCOUNTER — Other Ambulatory Visit: Payer: Self-pay | Admitting: Physician Assistant

## 2019-04-25 DIAGNOSIS — F419 Anxiety disorder, unspecified: Secondary | ICD-10-CM

## 2019-04-25 DIAGNOSIS — F3281 Premenstrual dysphoric disorder: Secondary | ICD-10-CM

## 2019-04-25 DIAGNOSIS — F410 Panic disorder [episodic paroxysmal anxiety] without agoraphobia: Secondary | ICD-10-CM

## 2019-04-25 DIAGNOSIS — F32A Depression, unspecified: Secondary | ICD-10-CM

## 2019-04-25 DIAGNOSIS — F329 Major depressive disorder, single episode, unspecified: Secondary | ICD-10-CM

## 2019-05-19 ENCOUNTER — Other Ambulatory Visit: Payer: Self-pay | Admitting: Physician Assistant

## 2019-05-19 DIAGNOSIS — F410 Panic disorder [episodic paroxysmal anxiety] without agoraphobia: Secondary | ICD-10-CM

## 2019-05-19 DIAGNOSIS — F3281 Premenstrual dysphoric disorder: Secondary | ICD-10-CM

## 2019-05-19 DIAGNOSIS — F419 Anxiety disorder, unspecified: Secondary | ICD-10-CM

## 2019-05-19 DIAGNOSIS — F329 Major depressive disorder, single episode, unspecified: Secondary | ICD-10-CM

## 2019-05-19 DIAGNOSIS — F32A Depression, unspecified: Secondary | ICD-10-CM

## 2019-05-27 ENCOUNTER — Other Ambulatory Visit: Payer: Self-pay | Admitting: Physician Assistant

## 2019-05-27 DIAGNOSIS — F419 Anxiety disorder, unspecified: Secondary | ICD-10-CM

## 2019-05-27 DIAGNOSIS — F32A Depression, unspecified: Secondary | ICD-10-CM

## 2019-05-27 DIAGNOSIS — F329 Major depressive disorder, single episode, unspecified: Secondary | ICD-10-CM

## 2019-05-27 DIAGNOSIS — F410 Panic disorder [episodic paroxysmal anxiety] without agoraphobia: Secondary | ICD-10-CM

## 2019-05-27 DIAGNOSIS — F3281 Premenstrual dysphoric disorder: Secondary | ICD-10-CM

## 2019-06-22 ENCOUNTER — Other Ambulatory Visit: Payer: Self-pay | Admitting: Physician Assistant

## 2019-06-22 DIAGNOSIS — F410 Panic disorder [episodic paroxysmal anxiety] without agoraphobia: Secondary | ICD-10-CM

## 2019-06-22 DIAGNOSIS — F419 Anxiety disorder, unspecified: Secondary | ICD-10-CM

## 2019-06-22 DIAGNOSIS — F329 Major depressive disorder, single episode, unspecified: Secondary | ICD-10-CM

## 2019-06-22 DIAGNOSIS — F3281 Premenstrual dysphoric disorder: Secondary | ICD-10-CM

## 2019-06-22 DIAGNOSIS — F32A Depression, unspecified: Secondary | ICD-10-CM

## 2019-06-23 ENCOUNTER — Other Ambulatory Visit: Payer: Self-pay | Admitting: Physician Assistant

## 2019-06-23 DIAGNOSIS — F410 Panic disorder [episodic paroxysmal anxiety] without agoraphobia: Secondary | ICD-10-CM

## 2019-06-23 DIAGNOSIS — F419 Anxiety disorder, unspecified: Secondary | ICD-10-CM

## 2019-06-23 DIAGNOSIS — F32A Depression, unspecified: Secondary | ICD-10-CM

## 2019-06-23 DIAGNOSIS — F329 Major depressive disorder, single episode, unspecified: Secondary | ICD-10-CM

## 2019-06-23 DIAGNOSIS — F3281 Premenstrual dysphoric disorder: Secondary | ICD-10-CM

## 2019-06-24 ENCOUNTER — Telehealth: Payer: Self-pay | Admitting: Neurology

## 2019-06-24 DIAGNOSIS — F3281 Premenstrual dysphoric disorder: Secondary | ICD-10-CM

## 2019-06-24 DIAGNOSIS — F410 Panic disorder [episodic paroxysmal anxiety] without agoraphobia: Secondary | ICD-10-CM

## 2019-06-24 DIAGNOSIS — F32A Depression, unspecified: Secondary | ICD-10-CM

## 2019-06-24 DIAGNOSIS — F329 Major depressive disorder, single episode, unspecified: Secondary | ICD-10-CM

## 2019-06-24 DIAGNOSIS — F419 Anxiety disorder, unspecified: Secondary | ICD-10-CM

## 2019-06-24 MED ORDER — SERTRALINE HCL 50 MG PO TABS: 50.0000 mg | ORAL_TABLET | Freq: Every day | ORAL | 0 refills | Status: DC

## 2019-06-24 NOTE — Telephone Encounter (Signed)
Appointment has been made. No further questions at this time.  

## 2019-06-24 NOTE — Telephone Encounter (Signed)
Patient left vm wanting refill of Zoloft without making appt. She does not want to come to the office due to Covid. Sent 15 day supply.    Please call and schedule patient for virtual follow up with Jade. Thanks!

## 2019-06-28 ENCOUNTER — Encounter: Payer: Self-pay | Admitting: Physician Assistant

## 2019-06-28 ENCOUNTER — Ambulatory Visit (INDEPENDENT_AMBULATORY_CARE_PROVIDER_SITE_OTHER): Payer: BC Managed Care – PPO | Admitting: Physician Assistant

## 2019-06-28 VITALS — Temp 97.3°F | Ht 62.0 in | Wt 109.0 lb

## 2019-06-28 DIAGNOSIS — E559 Vitamin D deficiency, unspecified: Secondary | ICD-10-CM | POA: Insufficient documentation

## 2019-06-28 DIAGNOSIS — F32A Depression, unspecified: Secondary | ICD-10-CM

## 2019-06-28 DIAGNOSIS — F410 Panic disorder [episodic paroxysmal anxiety] without agoraphobia: Secondary | ICD-10-CM

## 2019-06-28 DIAGNOSIS — F3281 Premenstrual dysphoric disorder: Secondary | ICD-10-CM | POA: Diagnosis not present

## 2019-06-28 DIAGNOSIS — D509 Iron deficiency anemia, unspecified: Secondary | ICD-10-CM

## 2019-06-28 DIAGNOSIS — Z13220 Encounter for screening for lipoid disorders: Secondary | ICD-10-CM

## 2019-06-28 DIAGNOSIS — F329 Major depressive disorder, single episode, unspecified: Secondary | ICD-10-CM

## 2019-06-28 DIAGNOSIS — Z131 Encounter for screening for diabetes mellitus: Secondary | ICD-10-CM

## 2019-06-28 DIAGNOSIS — F419 Anxiety disorder, unspecified: Secondary | ICD-10-CM | POA: Diagnosis not present

## 2019-06-28 MED ORDER — SERTRALINE HCL 50 MG PO TABS: 50.0000 mg | ORAL_TABLET | Freq: Every day | ORAL | 4 refills | Status: DC

## 2019-06-28 NOTE — Progress Notes (Signed)
Patient ID: Melissa Mcclain, female   DOB: 04/17/1986, 33 y.o.   MRN: [id number] .Marland KitchenVirtual Visit via Video Note  I connected with Melissa Mcclain on 06/28/19 at  9:30 AM EDT by a video enabled telemedicine application and verified that I am speaking with the correct person using two identifiers.  Location: Patient: car Provider: clinic   I discussed the limitations of evaluation and management by telemedicine and the availability of in person appointments. The patient expressed understanding and agreed to proceed.  History of Present Illness: Pt is a 33 yo female with PMDD, anxiety, depression, panic attacks who presents to the clinic for medication refills.   Her mood has been up and down for the year. Overall much better since starting and staying on zoloft. She is working with counselor as well. She has more energy, she is sleeping better, and eating healthier. She continues to have "bad days 1-2 days around her menstrual period". No SI/HC.  Marland Kitchen. Active Ambulatory Problems    Diagnosis Date Noted  . IDA (iron deficiency anemia) 08/16/2014  . Seasonal allergies 08/16/2014  . Asthma, cough variant 08/16/2014  . PMDD (premenstrual dysphoric disorder) 08/16/2014  . Anxiety and depression 08/16/2014  . Gastroesophageal reflux disease without esophagitis 08/16/2014  . Hyperlipidemia 08/16/2014  . 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 Status post vacuum-assisted vaginal delivery 08/22/2015  . 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 No energy 01/09/2017  . Irritable bowel syndrome with diarrhea 01/09/2017  . Acute left-sided low back pain without sciatica 06/12/2017  . Panic attacks 01/12/2018  . Trouble in sleeping 01/12/2018  . Vitamin D deficiency 06/28/2019   Resolved Ambulatory Problems    Diagnosis Date Noted  . Pregnant 12/17/2014  . Supervision of normal first pregnancy 01/18/2015  . Scabies 03/12/2015  . Cerumen impaction 03/12/2015  . [redacted] weeks  gestation of pregnancy   . Choroid plexus cyst of fetus   . Encounter for supervision of normal first pregnancy in second trimester   . Active labor at term 08/19/2015  . Obstetrical laceration, second degree 08/22/2015   Past Medical History:  Diagnosis Date  . Asthma   . Heart murmur    Reviewed med, problem, allergy list.      Observations/Objective: No acute distress. Normal mood and appearance.  No cough.   .. Today's Vitals   06/28/19 0912  Temp: (!) 97.3 F (36.3 C)  TempSrc: Temporal  Weight: 109 lb (49.4 kg)  Height: 5\' 2"  (1.575 m)   Body mass index is 19.94 kg/m.   .. Depression screen [facility] 2/9 06/28/2019 01/12/2018 01/11/2018 01/07/2017  Decreased Interest 1 3 3 1   Down, Depressed, Hopeless 1 3 3 1   PHQ - 2 Score 2 6 6 2   Altered sleeping 0 3 3 0  Tired, decreased energy 2 3 3 1   Change in appetite 3 3 3 3   Feeling bad or failure about yourself  1 2 2 1   Trouble concentrating 1 1 1 1   Moving slowly or fidgety/restless 0 1 1 0  Suicidal thoughts 0 0 0 0  PHQ-9 Score 9 19 19 8   Difficult doing work/chores Somewhat difficult Extremely dIfficult Extremely dIfficult -   .. GAD 7 : Generalized Anxiety Score 06/28/2019 01/12/2018 01/11/2018  Nervous, Anxious, on Edge 1 3 3   Control/stop worrying 1 3 3   Worry too much - different things 0 1 1  Trouble relaxing 0 3 3  Restless 0 3 3  Easily annoyed or irritable  0 2 2  Afraid - awful might happen 0 - 1  Total GAD 7 Score 2 - 16  Anxiety Difficulty Not difficult at all Extremely difficult Extremely difficult     Assessment and Plan: Marland KitchenMarland KitchenDiagnoses and all orders for this visit:  Anxiety and depression -     sertraline (ZOLOFT) 50 MG tablet; Take 1 tablet (50 mg total) by mouth daily. -     COMPLETE METABOLIC PANEL WITH GFR  PMDD (premenstrual dysphoric disorder) -     sertraline (ZOLOFT) 50 MG tablet; Take 1 tablet (50 mg total) by mouth daily. -     Lipid Panel w/reflex Direct LDL -     COMPLETE  METABOLIC PANEL WITH GFR -     TSH -     CBC -     Ferritin  Panic attacks -     sertraline (ZOLOFT) 50 MG tablet; Take 1 tablet (50 mg total) by mouth daily. -     COMPLETE METABOLIC PANEL WITH GFR  Screening for diabetes mellitus -     COMPLETE METABOLIC PANEL WITH GFR  Screening for lipid disorders -     Lipid Panel w/reflex Direct LDL  Iron deficiency anemia, unspecified iron deficiency anemia type -     CBC -     Ferritin  Vitamin D deficiency -     VITAMIN D 25 Hydroxy (Vit-D Deficiency, Fractures)   Refilled zoloft for one year.   Pt is in need of labs. Ordered labs. Encouraged to get pap smear.    Follow Up Instructions:    I discussed the assessment and treatment plan with the patient. The patient was provided an opportunity to ask questions and all were answered. The patient agreed with the plan and demonstrated an understanding of the instructions.   The patient was advised to call back or seek an in-person evaluation if the symptoms worsen or if the condition fails to improve as anticipated.    Iran Planas, PA-C

## 2019-06-28 NOTE — Progress Notes (Signed)
Doing okay. Needs refills. PHQ9-GAD7 completed.

## 2019-07-01 ENCOUNTER — Telehealth: Payer: Self-pay

## 2019-07-01 DIAGNOSIS — F3281 Premenstrual dysphoric disorder: Secondary | ICD-10-CM | POA: Diagnosis not present

## 2019-07-01 DIAGNOSIS — E559 Vitamin D deficiency, unspecified: Secondary | ICD-10-CM | POA: Diagnosis not present

## 2019-07-01 DIAGNOSIS — D509 Iron deficiency anemia, unspecified: Secondary | ICD-10-CM | POA: Diagnosis not present

## 2019-07-01 DIAGNOSIS — Z13220 Encounter for screening for lipoid disorders: Secondary | ICD-10-CM | POA: Diagnosis not present

## 2019-07-01 DIAGNOSIS — Z131 Encounter for screening for diabetes mellitus: Secondary | ICD-10-CM | POA: Diagnosis not present

## 2019-07-01 NOTE — Telephone Encounter (Signed)
[facility] called and states after patient had blood draw she felt lightheaded. Her blood pressure was 94/55. Gave patient crackers and soda. She sat in the wheel chair eating and drinking until she felt back to normal. She states this almost always happens when she has blood drawn. Rechecked blood pressure before release and it was 105/73. She reports she feels back to normal. Reviewed with Dr Madilyn Fireman.

## 2019-07-02 LAB — COMPLETE METABOLIC PANEL WITH GFR
AG Ratio: 2 (calc) (ref 1.0–2.5)
ALT: 10 U/L (ref 6–29)
AST: 13 U/L (ref 10–30)
Albumin: 4.5 g/dL (ref 3.6–5.1)
Alkaline phosphatase (APISO): 46 U/L (ref 31–125)
BUN: 13 mg/dL (ref 7–25)
CO2: 26 mmol/L (ref 20–32)
Calcium: 9.5 mg/dL (ref 8.6–10.2)
Chloride: 104 mmol/L (ref 98–110)
Creat: 0.73 mg/dL (ref 0.50–1.10)
GFR, Est African American: 125 mL/min/{1.73_m2} (ref >=60)
GFR, Est Non African American: 107 mL/min/{1.73_m2} (ref >=60)
Globulin: 2.3 g/dL (calc) (ref 1.9–3.7)
Glucose, Bld: 85 mg/dL (ref 65–99)
Potassium: 4.2 mmol/L (ref 3.5–5.3)
Sodium: 138 mmol/L (ref 135–146)
Total Bilirubin: 0.6 mg/dL (ref 0.2–1.2)
Total Protein: 6.8 g/dL (ref 6.1–8.1)

## 2019-07-02 LAB — CBC
HCT: 41.5 % (ref 35.0–45.0)
Hemoglobin: 13.9 g/dL (ref 11.7–15.5)
MCH: 31.5 pg (ref 27.0–33.0)
MCHC: 33.5 g/dL (ref 32.0–36.0)
MCV: 94.1 fL (ref 80.0–100.0)
MPV: 11 fL (ref 7.5–12.5)
Platelets: 215 10*3/uL (ref 140–400)
RBC: 4.41 10*6/uL (ref 3.80–5.10)
RDW: 12.4 % (ref 11.0–15.0)
WBC: 5 10*3/uL (ref 3.8–10.8)

## 2019-07-02 LAB — LIPID PANEL W/REFLEX DIRECT LDL
Cholesterol: 309 mg/dL — ABNORMAL HIGH (ref <200)
HDL: 72 mg/dL (ref >=50)
LDL Cholesterol (Calc): 214 mg/dL (calc) — ABNORMAL HIGH
Non-HDL Cholesterol (Calc): 237 mg/dL (calc) — ABNORMAL HIGH (ref <130)
Total CHOL/HDL Ratio: 4.3 (calc) (ref <5.0)
Triglycerides: 99 mg/dL (ref <150)

## 2019-07-02 LAB — TSH: TSH: 1.87 mIU/L

## 2019-07-02 LAB — FERRITIN: Ferritin: 11 ng/mL — ABNORMAL LOW (ref 16–154)

## 2019-07-02 LAB — VITAMIN D 25 HYDROXY (VIT D DEFICIENCY, FRACTURES): Vit D, 25-Hydroxy: 14 ng/mL — ABNORMAL LOW (ref 30–100)

## 2019-07-04 ENCOUNTER — Encounter: Payer: Self-pay | Admitting: Physician Assistant

## 2019-07-04 DIAGNOSIS — R790 Abnormal level of blood mineral: Secondary | ICD-10-CM | POA: Insufficient documentation

## 2019-07-04 NOTE — Progress Notes (Signed)
Melissa Mcclain,   Cholesterol is very elevated. Total cholesterol is over 300 and LDL is over 200. You need to be on cholesterol medication. Are you ok with me sending over medication?  Thyroid looks stable.  Kidney, liver, glucose look good.  CBC looks great.  Iron stores are still low. Consider taking at least iron supplement daily.  Vitamin D is still low. Need to start vitamin D 2000units daily.   This could help make you feel better and have more energy!

## 2019-09-09 DIAGNOSIS — Z20828 Contact with and (suspected) exposure to other viral communicable diseases: Secondary | ICD-10-CM | POA: Diagnosis not present

## 2019-09-13 DIAGNOSIS — Z03818 Encounter for observation for suspected exposure to other biological agents ruled out: Secondary | ICD-10-CM | POA: Diagnosis not present

## 2019-09-13 DIAGNOSIS — Z20828 Contact with and (suspected) exposure to other viral communicable diseases: Secondary | ICD-10-CM | POA: Diagnosis not present

## 2020-06-30 ENCOUNTER — Other Ambulatory Visit: Payer: Self-pay | Admitting: Physician Assistant

## 2020-06-30 DIAGNOSIS — F419 Anxiety disorder, unspecified: Secondary | ICD-10-CM

## 2020-06-30 DIAGNOSIS — F3281 Premenstrual dysphoric disorder: Secondary | ICD-10-CM

## 2020-06-30 DIAGNOSIS — F32A Depression, unspecified: Secondary | ICD-10-CM

## 2020-06-30 DIAGNOSIS — F410 Panic disorder [episodic paroxysmal anxiety] without agoraphobia: Secondary | ICD-10-CM

## 2020-07-23 ENCOUNTER — Other Ambulatory Visit: Payer: Self-pay | Admitting: Physician Assistant

## 2020-07-23 DIAGNOSIS — F3281 Premenstrual dysphoric disorder: Secondary | ICD-10-CM

## 2020-07-23 DIAGNOSIS — F32A Depression, unspecified: Secondary | ICD-10-CM

## 2020-07-23 DIAGNOSIS — F419 Anxiety disorder, unspecified: Secondary | ICD-10-CM

## 2020-07-23 DIAGNOSIS — F410 Panic disorder [episodic paroxysmal anxiety] without agoraphobia: Secondary | ICD-10-CM

## 2020-08-14 ENCOUNTER — Other Ambulatory Visit: Payer: Self-pay | Admitting: Physician Assistant

## 2020-08-14 DIAGNOSIS — F419 Anxiety disorder, unspecified: Secondary | ICD-10-CM

## 2020-08-14 DIAGNOSIS — F3281 Premenstrual dysphoric disorder: Secondary | ICD-10-CM

## 2020-08-14 DIAGNOSIS — F32A Depression, unspecified: Secondary | ICD-10-CM

## 2020-08-14 DIAGNOSIS — F410 Panic disorder [episodic paroxysmal anxiety] without agoraphobia: Secondary | ICD-10-CM

## 2020-08-17 ENCOUNTER — Encounter: Payer: Self-pay | Admitting: Physician Assistant

## 2020-08-17 ENCOUNTER — Other Ambulatory Visit: Payer: Self-pay

## 2020-08-17 ENCOUNTER — Ambulatory Visit (INDEPENDENT_AMBULATORY_CARE_PROVIDER_SITE_OTHER): Payer: Managed Care, Other (non HMO) | Admitting: Physician Assistant

## 2020-08-17 VITALS — BP 111/63 | HR 100 | Ht 62.0 in | Wt 118.0 lb

## 2020-08-17 DIAGNOSIS — Z13220 Encounter for screening for lipoid disorders: Secondary | ICD-10-CM

## 2020-08-17 DIAGNOSIS — R790 Abnormal level of blood mineral: Secondary | ICD-10-CM

## 2020-08-17 DIAGNOSIS — Z0000 Encounter for general adult medical examination without abnormal findings: Secondary | ICD-10-CM

## 2020-08-17 DIAGNOSIS — E559 Vitamin D deficiency, unspecified: Secondary | ICD-10-CM

## 2020-08-17 DIAGNOSIS — F32A Depression, unspecified: Secondary | ICD-10-CM

## 2020-08-17 DIAGNOSIS — Z131 Encounter for screening for diabetes mellitus: Secondary | ICD-10-CM

## 2020-08-17 DIAGNOSIS — L84 Corns and callosities: Secondary | ICD-10-CM | POA: Insufficient documentation

## 2020-08-17 DIAGNOSIS — F410 Panic disorder [episodic paroxysmal anxiety] without agoraphobia: Secondary | ICD-10-CM

## 2020-08-17 DIAGNOSIS — Z1329 Encounter for screening for other suspected endocrine disorder: Secondary | ICD-10-CM

## 2020-08-17 DIAGNOSIS — F419 Anxiety disorder, unspecified: Secondary | ICD-10-CM

## 2020-08-17 DIAGNOSIS — F3281 Premenstrual dysphoric disorder: Secondary | ICD-10-CM

## 2020-08-17 MED ORDER — SERTRALINE HCL 50 MG PO TABS: 50.0000 mg | ORAL_TABLET | Freq: Every day | ORAL | 3 refills | Status: DC

## 2020-08-17 NOTE — Patient Instructions (Signed)
Health Maintenance, Female Adopting a healthy lifestyle and getting preventive care are important in promoting health and wellness. Ask your health care provider about:  The right schedule for you to have regular tests and exams.  Things you can do on your own to prevent diseases and keep yourself healthy. What should I know about diet, weight, and exercise? Eat a healthy diet   Eat a diet that includes plenty of vegetables, fruits, low-fat dairy products, and lean protein.  Do not eat a lot of foods that are high in solid fats, added sugars, or sodium. Maintain a healthy weight Body mass index (BMI) is used to identify weight problems. It estimates body fat based on height and weight. Your health care provider can help determine your BMI and help you achieve or maintain a healthy weight. Get regular exercise Get regular exercise. This is one of the most important things you can do for your health. Most adults should:  Exercise for at least 150 minutes each week. The exercise should increase your heart rate and make you sweat (moderate-intensity exercise).  Do strengthening exercises at least twice a week. This is in addition to the moderate-intensity exercise.  Spend less time sitting. Even light physical activity can be beneficial. Watch cholesterol and blood lipids Have your blood tested for lipids and cholesterol at 35 years of age, then have this test every 5 years. Have your cholesterol levels checked more often if:  Your lipid or cholesterol levels are high.  You are older than 35 years of age.  You are at high risk for heart disease. What should I know about cancer screening? Depending on your health history and family history, you may need to have cancer screening at various ages. This may include screening for:  Breast cancer.  Cervical cancer.  Colorectal cancer.  Skin cancer.  Lung cancer. What should I know about heart disease, diabetes, and high blood  pressure? Blood pressure and heart disease  High blood pressure causes heart disease and increases the risk of stroke. This is more likely to develop in people who have high blood pressure readings, are of African descent, or are overweight.  Have your blood pressure checked: ? Every 3-5 years if you are 18-39 years of age. ? Every year if you are 40 years old or older. Diabetes Have regular diabetes screenings. This checks your fasting blood sugar level. Have the screening done:  Once every three years after age 40 if you are at a normal weight and have a low risk for diabetes.  More often and at a younger age if you are overweight or have a high risk for diabetes. What should I know about preventing infection? Hepatitis B If you have a higher risk for hepatitis B, you should be screened for this virus. Talk with your health care provider to find out if you are at risk for hepatitis B infection. Hepatitis C Testing is recommended for:  Everyone born from 1945 through 1965.  Anyone with known risk factors for hepatitis C. Sexually transmitted infections (STIs)  Get screened for STIs, including gonorrhea and chlamydia, if: ? You are sexually active and are younger than 35 years of age. ? You are older than 35 years of age and your health care provider tells you that you are at risk for this type of infection. ? Your sexual activity has changed since you were last screened, and you are at increased risk for chlamydia or gonorrhea. Ask your health care provider if   you are at risk.  Ask your health care provider about whether you are at high risk for HIV. Your health care provider may recommend a prescription medicine to help prevent HIV infection. If you choose to take medicine to prevent HIV, you should first get tested for HIV. You should then be tested every 3 months for as long as you are taking the medicine. Pregnancy  If you are about to stop having your period (premenopausal) and  you may become pregnant, seek counseling before you get pregnant.  Take 400 to 800 micrograms (mcg) of folic acid every day if you become pregnant.  Ask for birth control (contraception) if you want to prevent pregnancy. Osteoporosis and menopause Osteoporosis is a disease in which the bones lose minerals and strength with aging. This can result in bone fractures. If you are 65 years old or older, or if you are at risk for osteoporosis and fractures, ask your health care provider if you should:  Be screened for bone loss.  Take a calcium or vitamin D supplement to lower your risk of fractures.  Be given hormone replacement therapy (HRT) to treat symptoms of menopause. Follow these instructions at home: Lifestyle  Do not use any products that contain nicotine or tobacco, such as cigarettes, e-cigarettes, and chewing tobacco. If you need help quitting, ask your health care provider.  Do not use street drugs.  Do not share needles.  Ask your health care provider for help if you need support or information about quitting drugs. Alcohol use  Do not drink alcohol if: ? Your health care provider tells you not to drink. ? You are pregnant, may be pregnant, or are planning to become pregnant.  If you drink alcohol: ? Limit how much you use to 0-1 drink a day. ? Limit intake if you are breastfeeding.  Be aware of how much alcohol is in your drink. In the [place] one drink equals one 12 oz bottle of beer (355 mL), one 5 oz glass of wine (148 mL), or one 1 oz glass of hard liquor (44 mL). General instructions  Schedule regular health, dental, and eye exams.  Stay current with your vaccines.  Tell your health care provider if: ? You often feel depressed. ? You have ever been abused or do not feel safe at home. Summary  Adopting a healthy lifestyle and getting preventive care are important in promoting health and wellness.  Follow your health care provider's instructions about healthy  diet, exercising, and getting tested or screened for diseases.  Follow your health care provider's instructions on monitoring your cholesterol and blood pressure. This information is not intended to replace advice given to you by your health care provider. Make sure you discuss any questions you have with your health care provider. Document Revised: 08/11/2018 Document Reviewed: 08/11/2018 [employer]vier Patient Education  2020 [employer]  

## 2020-08-17 NOTE — Progress Notes (Signed)
Subjective:     Melissa Mcclain is a 34 y.o. female and is here for a comprehensive physical exam. The patient reports problems - see below.  Pt does have a corn on the medial 3rd toe of the left foot. Painful to palpate. Present for years. OTC creams, pads failed.   Social History   Socioeconomic History  . Marital status: Married    Spouse name: Not on file  . Number of children: Not on file  . Years of education: Not on file  . Highest education level: Not on file  Occupational History  . Not on file  Tobacco Use  . Smoking status: Never Smoker  . Smokeless tobacco: Never Used  Substance and Sexual Activity  . Alcohol use: Yes    Alcohol/week: 0.0 standard drinks  . Drug use: No  . Sexual activity: Yes    Birth control/protection: None  Other Topics Concern  . Not on file  Social History Narrative  . Not on file   Social Determinants of Health   Financial Resource Strain: Not on file  Food Insecurity: Not on file  Transportation Needs: Not on file  Physical Activity: Not on file  Stress: Not on file  Social Connections: Not on file  Intimate Partner Violence: Not on file   Health Maintenance  Topic Date Due  . PAP SMEAR-Modifier  08/17/2020 (Originally 01/17/2018)  . Hepatitis C Screening  08/17/2021 (Originally 1985-12-13)  . TETANUS/TDAP  05/20/2025  . INFLUENZA VACCINE  Completed  . COVID-19 Vaccine  Completed  . HIV Screening  Completed    The following portions of the patient's history were reviewed and updated as appropriate: allergies, current medications, past family history, past medical history, past social history, past surgical history and problem list.  Review of Systems Pertinent items noted in HPI and remainder of comprehensive ROS otherwise negative.   Objective:    BP 111/63   Pulse 100   Ht 5\' 2"  (1.575 m)   Wt 118 lb (53.5 kg)   SpO2 99%   BMI 21.58 kg/m  General appearance: alert, cooperative and appears stated age Head:  Normocephalic, without obvious abnormality, atraumatic Eyes: conjunctivae/corneas clear. PERRL, EOM's intact. Fundi benign. Ears: normal TM's and external ear canals both ears Nose: Nares normal. Septum midline. Mucosa normal. No drainage or sinus tenderness. Throat: lips, mucosa, and tongue normal; teeth and gums normal Neck: no adenopathy, no carotid bruit, no JVD, supple, symmetrical, trachea midline and thyroid not enlarged, symmetric, no tenderness/mass/nodules Back: symmetric, no curvature. ROM normal. No CVA tenderness. Lungs: clear to auscultation bilaterally Heart: regular rate and rhythm, S1, S2 normal, no murmur, click, rub or gallop Abdomen: soft, non-tender; bowel sounds normal; no masses,  no organomegaly Extremities: extremities normal, atraumatic, no cyanosis or edema Pulses: 2+ and symmetric Skin: Skin color, texture, turgor normal. No rashes or lesions or hardened painful nodule of left 3rd toe. Lymph nodes: Cervical, supraclavicular, and axillary nodes normal. Neurologic: Alert and oriented X 3, normal strength and tone. Normal symmetric reflexes. Normal coordination and gait    Assessment:    Healthy female exam.     Plan:  Marland KitchenMarland KitchenMelissa was seen today for anxiety and depression.  Diagnoses and all orders for this visit:  Routine physical examination  Screening for diabetes mellitus -     COMPLETE METABOLIC PANEL WITH GFR  Screening for lipid disorders -     Lipid Panel w/reflex Direct LDL  Vitamin D deficiency -     Vitamin D (25  hydroxy)  Thyroid disorder screen -     TSH  Low iron stores -     CBC with Differential/Platelet -     Ferritin  PMDD (premenstrual dysphoric disorder) -     sertraline (ZOLOFT) 50 MG tablet; Take 1 tablet (50 mg total) by mouth daily.  Anxiety and depression -     sertraline (ZOLOFT) 50 MG tablet; Take 1 tablet (50 mg total) by mouth daily.  Panic attacks -     sertraline (ZOLOFT) 50 MG tablet; Take 1 tablet (50 mg total)  by mouth daily.  Corn of toe  .Marland Kitchen Discussed 150 minutes of exercise a week.  Encouraged vitamin D 1000 units and Calcium 1300mg  or 4 servings of dairy a day.  Fasting labs ordered.  Needs PAP. Pt aware.  Flu/covid vaccine UTD.   PHQ/GAD stable. Refilled zoloft.   Discussed corn. Attempt cryotherapy.   Cryotherapy Procedure Note  Pre-operative Diagnosis: corn  Post-operative Diagnosis: corn  Locations: left 3rd medial distal toe  Indications: painful  Procedure Details  History of allergy to iodine: no. Pacemaker? no.  Patient informed of risks (permanent scarring, infection, light or dark discoloration, bleeding, infection, weakness, numbness and recurrence of the lesion) and benefits of the procedure and verbal informed consent obtained.  The areas are treated with liquid nitrogen therapy, frozen until ice ball extended 2 mm beyond lesion, allowed to thaw, and treated again. The patient tolerated procedure well.  The patient was instructed on post-op care, warned that there may be blister formation, redness and pain. Recommend OTC analgesia as needed for pain.  Condition: Stable  Complications: none.  Plan: 1. Instructed to keep the area dry and covered for 24-48h and clean thereafter. 2. Warning signs of infection were reviewed.   3. Recommended that the patient use OTC acetaminophen as needed for pain.  4. Return in 2 weeks.      See After Visit Summary for Counseling Recommendations

## 2020-10-19 ENCOUNTER — Telehealth (INDEPENDENT_AMBULATORY_CARE_PROVIDER_SITE_OTHER): Payer: Managed Care, Other (non HMO) | Admitting: Family Medicine

## 2020-10-19 ENCOUNTER — Encounter: Payer: Self-pay | Admitting: Family Medicine

## 2020-10-19 DIAGNOSIS — J0140 Acute pansinusitis, unspecified: Secondary | ICD-10-CM | POA: Diagnosis not present

## 2020-10-19 DIAGNOSIS — J45991 Cough variant asthma: Secondary | ICD-10-CM

## 2020-10-19 DIAGNOSIS — R059 Cough, unspecified: Secondary | ICD-10-CM

## 2020-10-19 MED ORDER — BUDESONIDE-FORMOTEROL FUMARATE 160-4.5 MCG/ACT IN AERO: 2.0000 | INHALATION_SPRAY | Freq: Two times a day (BID) | RESPIRATORY_TRACT | 3 refills | Status: DC

## 2020-10-19 MED ORDER — GUAIFENESIN-CODEINE 100-10 MG/5ML PO SOLN: 5.0000 mL | Freq: Three times a day (TID) | ORAL | 0 refills | Status: AC | PRN

## 2020-10-19 MED ORDER — AMOXICILLIN-POT CLAVULANATE 875-125 MG PO TABS: 1.0000 | ORAL_TABLET | Freq: Two times a day (BID) | ORAL | 0 refills | Status: DC

## 2020-10-19 NOTE — Progress Notes (Signed)
Pt stated it started 2 weeks ago  coughing up phlegm and feeling congestion   OTC claritin and mucinex

## 2020-10-19 NOTE — Progress Notes (Signed)
Virtual Video Visit via MyChart Note - converted to telephone visit due to technical difficulties  I connected with  Melissa Mcclain on 10/19/20 at  1:00 PM EST by the video enabled telemedicine application for , MyChart, and verified that I am speaking with the correct person using two identifiers.   I introduced myself as a Designer, jewellery with the practice. We discussed the limitations of evaluation and management by telemedicine and the availability of in person appointments. The patient expressed understanding and agreed to proceed.  Participating parties in this visit include: The patient and the nurse practitioner listed.  The patient is: In parked car I am: In the office - [facility]  Subjective:    CC: No chief complaint on file.   HPI: Melissa Mcclain is a 35 y.o. year old female presenting today via [facility] today for worsening cough and sinus symptoms.  Patient reports she first started noticing some congestion about 2 weeks ago. Symptoms have gradually worsened and include sore throat, nasal congestion, post-nasal drainage, rhinorrhea, sinus pressure worse at ethmoid sinuses, cough. She reports the cough has become terrible recently. It is almost constant throughout the day and even worse at night, causing her to have trouble sleeping. She reports she is able to cough up mucus, but not specifically purulent per description. She reports some of her coughing fits have led to vomiting - states she has had this worked up in the past and was told she seems to get asthma-like responses following URIs. Many years ago she was given a Symbicort inhaler - she did use it a few times during this illness and feels like it may be helping some, however she doesn't know how full it is (and likely expired). She has also tried Flonase, Mucinex, Claritin with minimal improvement in symptoms. She also reports some dyspnea with exertion. She denies chest pain, wheezing,  palpitations/tachycardia, fevers, GI symptoms, dizziness. She is mostly concerned about the sinus pressure, congestion, and worsening cough.  At home COVID test was negative.    Past medical history, Surgical history, Family history not pertinant except as noted below, Social history, Allergies, and medications have been entered into the medical record, reviewed, and corrections made.   Review of Systems:  All review of systems negative except what is listed in the HPI   Objective:    General:  Speaking clearly in complete sentences. Mild shortness of breath noted.   Alert and oriented x3.   Normal judgment.  Absent acute distress. Productive cough noted during telephone call.   Impression and Recommendations:    1. Acute pansinusitis, recurrence not specified - guaiFENesin-codeine 100-10 MG/5ML syrup; Take 5 mLs by mouth 3 (three) times daily as needed for up to 4 days for cough.  Dispense: 60 mL; Refill: 0 - amoxicillin-clavulanate (AUGMENTIN) 875-125 MG tablet; Take 1 tablet by mouth 2 (two) times daily for 7 days.  Dispense: 14 tablet; Refill: 0  2. Cough - guaiFENesin-codeine 100-10 MG/5ML syrup; Take 5 mLs by mouth 3 (three) times daily as needed for up to 4 days for cough.  Dispense: 60 mL; Refill: 0 - amoxicillin-clavulanate (AUGMENTIN) 875-125 MG tablet; Take 1 tablet by mouth 2 (two) times daily for 7 days.  Dispense: 14 tablet; Refill: 0 - budesonide-formoterol (SYMBICORT) 160-4.5 MCG/ACT inhaler; Inhale 2 puffs into the lungs 2 (two) times daily.  Dispense: 1 each; Refill: 3  3. Asthma, cough variant - guaiFENesin-codeine 100-10 MG/5ML syrup; Take 5 mLs by mouth 3 (three) times daily as  needed for up to 4 days for cough.  Dispense: 60 mL; Refill: 0 - budesonide-formoterol (SYMBICORT) 160-4.5 MCG/ACT inhaler; Inhale 2 puffs into the lungs 2 (two) times daily.  Dispense: 1 each; Refill: 3  Given patient's worsening cough and sinusitis symptoms over the past 2 weeks, we  will treat with antibiotics. With her history of asthma following URI, we want to cover for potential of bronchitis and pneumonia early in course. I am re-ordering the Symbicort she used in the past from an allergist, in hopes that this will control her bronchospasms and help her feel better soon. Will also send in some Robitussin [facility] - encouraged patient to reserve this for bedtime as we need coughs and good pulmonary hygiene to keep her lungs clear. Encouraged her to continue the Flonase, Mucinex, and Claritin as well as stay hydrated, use humidifiers, and try to rest.   Follow-up if symptoms worsen or fail to improve.    I discussed the assessment and treatment plan with the patient. The patient was provided an opportunity to ask questions and all were answered. The patient agreed with the plan and demonstrated an understanding of the instructions.   The patient was advised to call back or seek an in-person evaluation if the symptoms worsen or if the condition fails to improve as anticipated.  I provided 20 minutes of non-face-to-face interaction with this [place] visit including intake, same-day documentation, and chart review.   Terrilyn Saver, NP

## 2020-10-19 NOTE — Patient Instructions (Signed)
I'm sorry you are not feeling well. I've sent in your prescriptions. Try to reserve the cough syrup for bedtime as we want to keep coughing some to clear out your lungs. Let us know if your symptoms worsen or do not start improving soon.    Upper Respiratory Infection, Adult An upper respiratory infection (URI) affects the nose, throat, and upper air passages. URIs are caused by germs (viruses). The most common type of URI is often called "the common cold." Medicines cannot cure URIs, but you can do things at home to relieve your symptoms. URIs usually get better within 7-10 days. Follow these instructions at home: Activity  Rest as needed.  If you have a fever, stay home from work or school until your fever is gone, or until your doctor says you may return to work or school. ? You should stay home until you cannot spread the infection anymore (you are not contagious). ? Your doctor may have you wear a face mask so you have less risk of spreading the infection. Relieving symptoms  Gargle with a salt-water mixture 3-4 times a day or as needed. To make a salt-water mixture, completely dissolve -1 tsp of salt in 1 cup of warm water.  Use a cool-mist humidifier to add moisture to the air. This can help you breathe more easily. Eating and drinking  Drink enough fluid to keep your pee (urine) pale yellow.  Eat soups and other clear broths.   General instructions  Take over-the-counter and prescription medicines only as told by your doctor. These include cold medicines, fever reducers, and cough suppressants.  Do not use any products that contain nicotine or tobacco. These include cigarettes and e-cigarettes. If you need help quitting, ask your doctor.  Avoid being where people are smoking (avoid secondhand smoke).  Make sure you get regular shots and get the flu shot every year.  Keep all follow-up visits as told by your doctor. This is important.   How to avoid spreading infection to  others  Wash your hands often with soap and water. If you do not have soap and water, use hand sanitizer.  Avoid touching your mouth, face, eyes, or nose.  Cough or sneeze into a tissue or your sleeve or elbow. Do not cough or sneeze into your hand or into the air.   Contact a doctor if:  You are getting worse, not better.  You have any of these: ? A fever. ? Chills. ? Brown or red mucus in your nose. ? Yellow or brown fluid (discharge)coming from your nose. ? Pain in your face, especially when you bend forward. ? Swollen neck glands. ? Pain with swallowing. ? White areas in the back of your throat. Get help right away if:  You have shortness of breath that gets worse.  You have very bad or constant: ? Headache. ? Ear pain. ? Pain in your forehead, behind your eyes, and over your cheekbones (sinus pain). ? Chest pain.  You have long-lasting (chronic) lung disease along with any of these: ? Wheezing. ? Long-lasting cough. ? Coughing up blood. ? A change in your usual mucus.  You have a stiff neck.  You have changes in your: ? Vision. ? Hearing. ? Thinking. ? Mood. Summary  An upper respiratory infection (URI) is caused by a germ called a virus. The most common type of URI is often called "the common cold."  URIs usually get better within 7-10 days.  Take over-the-counter and prescription medicines only  as told by your doctor. This information is not intended to replace advice given to you by your health care provider. Make sure you discuss any questions you have with your health care provider. Document Revised: 04/26/2020 Document Reviewed: 04/26/2020 Elsevier Patient Education  [place].

## 2020-10-23 ENCOUNTER — Telehealth: Payer: Self-pay | Admitting: Neurology

## 2020-10-23 NOTE — Telephone Encounter (Signed)
Patient called to see if she could do a breathing treatment in the office tomorrow at appt. Has ongoing cough. Did make her aware since Covid we can not do neb treatments in the office. She has had done at her allergist in the past and is going to call them to schedule.

## 2020-10-24 ENCOUNTER — Other Ambulatory Visit: Payer: Self-pay

## 2020-10-24 ENCOUNTER — Other Ambulatory Visit (HOSPITAL_COMMUNITY)
Admission: RE | Admit: 2020-10-24 | Discharge: 2020-10-24 | Disposition: A | Discharge status: Home / Self Care | Payer: Managed Care, Other (non HMO) | Source: Ambulatory Visit | Attending: Physician Assistant | Admitting: Physician Assistant

## 2020-10-24 ENCOUNTER — Ambulatory Visit (INDEPENDENT_AMBULATORY_CARE_PROVIDER_SITE_OTHER): Payer: Managed Care, Other (non HMO) | Admitting: Physician Assistant

## 2020-10-24 ENCOUNTER — Encounter: Payer: Self-pay | Admitting: Physician Assistant

## 2020-10-24 VITALS — BP 110/70 | HR 90 | Ht 62.0 in | Wt 120.0 lb

## 2020-10-24 DIAGNOSIS — J45991 Cough variant asthma: Secondary | ICD-10-CM

## 2020-10-24 DIAGNOSIS — F411 Generalized anxiety disorder: Secondary | ICD-10-CM

## 2020-10-24 DIAGNOSIS — E559 Vitamin D deficiency, unspecified: Secondary | ICD-10-CM | POA: Diagnosis not present

## 2020-10-24 DIAGNOSIS — F410 Panic disorder [episodic paroxysmal anxiety] without agoraphobia: Secondary | ICD-10-CM

## 2020-10-24 DIAGNOSIS — Z0000 Encounter for general adult medical examination without abnormal findings: Secondary | ICD-10-CM

## 2020-10-24 DIAGNOSIS — Z124 Encounter for screening for malignant neoplasm of cervix: Secondary | ICD-10-CM | POA: Insufficient documentation

## 2020-10-24 DIAGNOSIS — F3281 Premenstrual dysphoric disorder: Secondary | ICD-10-CM

## 2020-10-24 MED ORDER — PREDNISONE 50 MG PO TABS: 50.0000 mg | ORAL_TABLET | Freq: Every day | ORAL | 0 refills | Status: DC

## 2020-10-24 MED ORDER — ALBUTEROL SULFATE (2.5 MG/3ML) 0.083% IN NEBU: 2.5000 mg | INHALATION_SOLUTION | RESPIRATORY_TRACT | 0 refills | Status: DC | PRN

## 2020-10-24 MED ORDER — ERGOCALCIFEROL 1.25 MG (50000 UT) PO CAPS
50000.0000 [IU] | ORAL_CAPSULE | ORAL | 3 refills | Status: DC
Start: 2020-10-24 — End: 2021-08-12

## 2020-10-24 NOTE — Progress Notes (Signed)
Subjective:     Melissa Mcclain is a 35 y.o. female and is here for a comprehensive physical exam. The patient reports problems - she continues to have some cough persisting after sinus infection. has history of asthma. continues to feel tight in chest. does not have albuterol. uses symbicort during flares. last day of abx of yesterday. .  Social History   Socioeconomic History  . Marital status: Married    Spouse name: Not on file  . Number of children: Not on file  . Years of education: Not on file  . Highest education level: Not on file  Occupational History  . Not on file  Tobacco Use  . Smoking status: Never Smoker  . Smokeless tobacco: Never Used  Substance and Sexual Activity  . Alcohol use: Yes    Alcohol/week: 0.0 standard drinks  . Drug use: No  . Sexual activity: Yes    Birth control/protection: None  Other Topics Concern  . Not on file  Social History Narrative  . Not on file   Social Determinants of Health   Financial Resource Strain: Not on file  Food Insecurity: Not on file  Transportation Needs: Not on file  Physical Activity: Not on file  Stress: Not on file  Social Connections: Not on file  Intimate Partner Violence: Not on file   Health Maintenance  Topic Date Due  . PAP SMEAR-Modifier  01/17/2018  . Hepatitis C Screening  08/17/2021 (Originally 12-11-1985)  . TETANUS/TDAP  05/20/2025  . INFLUENZA VACCINE  Completed  . COVID-19 Vaccine  Completed  . HIV Screening  Completed    The following portions of the patient's history were reviewed and updated as appropriate: allergies, current medications, past family history, past medical history, past social history, past surgical history and problem list.  Review of Systems Pertinent items noted in HPI and remainder of comprehensive ROS otherwise negative.   Objective:    BP 110/70   Pulse 90   Ht 5\' 2"  (1.575 m)   Wt 120 lb (54.4 kg)   SpO2 100%   BMI 21.95 kg/m  General appearance: alert,  cooperative and appears stated age Head: Normocephalic, without obvious abnormality, atraumatic Eyes: conjunctivae/corneas clear. PERRL, EOM's intact. Fundi benign. Ears: normal TM's and external ear canals both ears Nose: Nares normal. Septum midline. Mucosa normal. No drainage or sinus tenderness. Throat: lips, mucosa, and tongue normal; teeth and gums normal Neck: no adenopathy, no carotid bruit, no JVD, supple, symmetrical, trachea midline and thyroid not enlarged, symmetric, no tenderness/mass/nodules Back: symmetric, no curvature. ROM normal. No CVA tenderness. Lungs: scant inspiratory wheezing through out both middle lungs.  dry cough through visit.  Breasts: normal appearance, no masses or tenderness Heart: regular rate and rhythm, S1, S2 normal, no murmur, click, rub or gallop Abdomen: soft, non-tender; bowel sounds normal; no masses,  no organomegaly Pelvic: cervix normal in appearance, external genitalia normal, no adnexal masses or tenderness, no cervical motion tenderness, uterus normal size, shape, and consistency and vagina normal without discharge Extremities: extremities normal, atraumatic, no cyanosis or edema Pulses: 2+ and symmetric Skin: Skin color, texture, turgor normal. No rashes or lesions Lymph nodes: Cervical, supraclavicular, and axillary nodes normal. Neurologic: Alert and oriented X 3, normal strength and tone. Normal symmetric reflexes. Normal coordination and gait   .Marland Kitchen Depression screen [facility] 2/9 10/24/2020 08/17/2020 06/28/2019 01/12/2018 01/11/2018  Decreased Interest 1 2 1 3 3   Down, Depressed, Hopeless 1 1 1 3 3   PHQ - 2 Score 2 3  2 6 6   Altered sleeping 2 1 0 3 3  Tired, decreased energy 2 1 2 3 3   Change in appetite 1 1 3 3 3   Feeling bad or failure about yourself  0 0 1 2 2   Trouble concentrating 2 0 1 1 1   Moving slowly or fidgety/restless 1 1 0 1 1  Suicidal thoughts 0 0 0 0 0  PHQ-9 Score 10 7 9 19 19   Difficult doing work/chores Somewhat difficult  Somewhat difficult Somewhat difficult Extremely dIfficult Extremely dIfficult   .Marland Kitchen GAD 7 : Generalized Anxiety Score 10/24/2020 08/17/2020 06/28/2019 01/12/2018  Nervous, Anxious, on Edge 3 0 1 3  Control/stop worrying 2 0 1 3  Worry too much - different things 3 0 0 1  Trouble relaxing 2 0 0 3  Restless 0 1 0 3  Easily annoyed or irritable 2 3 0 2  Afraid - awful might happen 1 0 0 -  Total GAD 7 Score 13 4 2  -  Anxiety Difficulty Somewhat difficult Not difficult at all Not difficult at all Extremely difficult     Assessment:    Healthy female exam.      Plan:    Marland KitchenMarland KitchenMelissa was seen today for annual exam.  Diagnoses and all orders for this visit:  Routine physical examination  Asthma, cough variant -     For home use only DME Nebulizer machine -     albuterol (PROVENTIL) (2.5 MG/3ML) 0.083% nebulizer solution; Take 3 mLs (2.5 mg total) by nebulization every 4 (four) hours as needed for wheezing or shortness of breath (please include nebulizer machine, hoses, and mask if needed.). -     predniSONE (DELTASONE) 50 MG tablet; Take 1 tablet (50 mg total) by mouth daily.  Routine Papanicolaou smear -     Cytology - PAP  Vitamin D insufficiency -     ergocalciferol (VITAMIN D2) 1.25 MG (50000 UT) capsule; Take 1 capsule (50,000 Units total) by mouth once a week.  Panic attacks  PMDD (premenstrual dysphoric disorder)  GAD (generalized anxiety disorder)   .Marland Kitchen Discussed 150 minutes of exercise a week.  Encouraged vitamin D 1000 units and Calcium 1300mg  or 4 servings of dairy a day.  Fasting labs ordered.  Pap done today.  covid vaccine UTD.  Flu shot done.   Scant wheezing heard on lung exam. Added burst of prednisone. Rescue nebulizer ordered today to use as needed. Consider starting daily anti-histamine. Follow up as needed or if not improving.   PHQ/GAD numbers are elevated. On zoloft. Discussed increased. Pt does not want to do that. She is getting ready to quit her job  and thinks that will help a lot.   See After Visit Summary for Counseling Recommendations

## 2020-10-24 NOTE — Patient Instructions (Signed)
Health Maintenance, Female Adopting a healthy lifestyle and getting preventive care are important in promoting health and wellness. Ask your health care provider about:  The right schedule for you to have regular tests and exams.  Things you can do on your own to prevent diseases and keep yourself healthy. What should I know about diet, weight, and exercise? Eat a healthy diet  Eat a diet that includes plenty of vegetables, fruits, low-fat dairy products, and lean protein.  Do not eat a lot of foods that are high in solid fats, added sugars, or sodium.   Maintain a healthy weight Body mass index (BMI) is used to identify weight problems. It estimates body fat based on height and weight. Your health care provider can help determine your BMI and help you achieve or maintain a healthy weight. Get regular exercise Get regular exercise. This is one of the most important things you can do for your health. Most adults should:  Exercise for at least 150 minutes each week. The exercise should increase your heart rate and make you sweat (moderate-intensity exercise).  Do strengthening exercises at least twice a week. This is in addition to the moderate-intensity exercise.  Spend less time sitting. Even light physical activity can be beneficial. Watch cholesterol and blood lipids Have your blood tested for lipids and cholesterol at 36 years of age, then have this test every 5 years. Have your cholesterol levels checked more often if:  Your lipid or cholesterol levels are high.  You are older than 36 years of age.  You are at high risk for heart disease. What should I know about cancer screening? Depending on your health history and family history, you may need to have cancer screening at various ages. This may include screening for:  Breast cancer.  Cervical cancer.  Colorectal cancer.  Skin cancer.  Lung cancer. What should I know about heart disease, diabetes, and high blood  pressure? Blood pressure and heart disease  High blood pressure causes heart disease and increases the risk of stroke. This is more likely to develop in people who have high blood pressure readings, are of African descent, or are overweight.  Have your blood pressure checked: ? Every 3-5 years if you are 18-39 years of age. ? Every year if you are 40 years old or older. Diabetes Have regular diabetes screenings. This checks your fasting blood sugar level. Have the screening done:  Once every three years after age 40 if you are at a normal weight and have a low risk for diabetes.  More often and at a younger age if you are overweight or have a high risk for diabetes. What should I know about preventing infection? Hepatitis B If you have a higher risk for hepatitis B, you should be screened for this virus. Talk with your health care provider to find out if you are at risk for hepatitis B infection. Hepatitis C Testing is recommended for:  Everyone born from 1945 through 1965.  Anyone with known risk factors for hepatitis C. Sexually transmitted infections (STIs)  Get screened for STIs, including gonorrhea and chlamydia, if: ? You are sexually active and are younger than 36 years of age. ? You are older than 36 years of age and your health care provider tells you that you are at risk for this type of infection. ? Your sexual activity has changed since you were last screened, and you are at increased risk for chlamydia or gonorrhea. Ask your health care provider   if you are at risk.  Ask your health care provider about whether you are at high risk for HIV. Your health care provider may recommend a prescription medicine to help prevent HIV infection. If you choose to take medicine to prevent HIV, you should first get tested for HIV. You should then be tested every 3 months for as long as you are taking the medicine. Pregnancy  If you are about to stop having your period (premenopausal) and  you may become pregnant, seek counseling before you get pregnant.  Take 400 to 800 micrograms (mcg) of folic acid every day if you become pregnant.  Ask for birth control (contraception) if you want to prevent pregnancy. Osteoporosis and menopause Osteoporosis is a disease in which the bones lose minerals and strength with aging. This can result in bone fractures. If you are 65 years old or older, or if you are at risk for osteoporosis and fractures, ask your health care provider if you should:  Be screened for bone loss.  Take a calcium or vitamin D supplement to lower your risk of fractures.  Be given hormone replacement therapy (HRT) to treat symptoms of menopause. Follow these instructions at home: Lifestyle  Do not use any products that contain nicotine or tobacco, such as cigarettes, e-cigarettes, and chewing tobacco. If you need help quitting, ask your health care provider.  Do not use street drugs.  Do not share needles.  Ask your health care provider for help if you need support or information about quitting drugs. Alcohol use  Do not drink alcohol if: ? Your health care provider tells you not to drink. ? You are pregnant, may be pregnant, or are planning to become pregnant.  If you drink alcohol: ? Limit how much you use to 0-1 drink a day. ? Limit intake if you are breastfeeding.  Be aware of how much alcohol is in your drink. In the [place] one drink equals one 12 oz bottle of beer (355 mL), one 5 oz glass of wine (148 mL), or one 1 oz glass of hard liquor (44 mL). General instructions  Schedule regular health, dental, and eye exams.  Stay current with your vaccines.  Tell your health care provider if: ? You often feel depressed. ? You have ever been abused or do not feel safe at home. Summary  Adopting a healthy lifestyle and getting preventive care are important in promoting health and wellness.  Follow your health care provider's instructions about healthy  diet, exercising, and getting tested or screened for diseases.  Follow your health care provider's instructions on monitoring your cholesterol and blood pressure. This information is not intended to replace advice given to you by your health care provider. Make sure you discuss any questions you have with your health care provider. Document Revised: 08/11/2018 Document Reviewed: 08/11/2018 Elsevier Patient Education  2021 Elsevier Inc.  

## 2020-10-25 LAB — CBC WITH DIFFERENTIAL/PLATELET
Absolute Monocytes: 492 cells/uL (ref 200–950)
Basophils Absolute: 42 cells/uL (ref 0–200)
Basophils Relative: 0.7 %
Eosinophils Absolute: 48 cells/uL (ref 15–500)
Eosinophils Relative: 0.8 %
HCT: 41.7 % (ref 35.0–45.0)
Hemoglobin: 14.3 g/dL (ref 11.7–15.5)
Lymphs Abs: 1518 cells/uL (ref 850–3900)
MCH: 31.9 pg (ref 27.0–33.0)
MCHC: 34.3 g/dL (ref 32.0–36.0)
MCV: 93.1 fL (ref 80.0–100.0)
MPV: 10.9 fL (ref 7.5–12.5)
Monocytes Relative: 8.2 %
Neutro Abs: 3900 cells/uL (ref 1500–7800)
Neutrophils Relative %: 65 %
Platelets: 234 10*3/uL (ref 140–400)
RBC: 4.48 10*6/uL (ref 3.80–5.10)
RDW: 12.1 % (ref 11.0–15.0)
Total Lymphocyte: 25.3 %
WBC: 6 10*3/uL (ref 3.8–10.8)

## 2020-10-25 LAB — LIPID PANEL W/REFLEX DIRECT LDL
Cholesterol: 283 mg/dL — ABNORMAL HIGH (ref <200)
HDL: 66 mg/dL (ref >=50)
LDL Cholesterol (Calc): 193 mg/dL (calc) — ABNORMAL HIGH
Non-HDL Cholesterol (Calc): 217 mg/dL (calc) — ABNORMAL HIGH (ref <130)
Total CHOL/HDL Ratio: 4.3 (calc) (ref <5.0)
Triglycerides: 116 mg/dL (ref <150)

## 2020-10-25 LAB — COMPLETE METABOLIC PANEL WITH GFR
AG Ratio: 1.6 (calc) (ref 1.0–2.5)
ALT: 13 U/L (ref 6–29)
AST: 16 U/L (ref 10–30)
Albumin: 4.6 g/dL (ref 3.6–5.1)
Alkaline phosphatase (APISO): 60 U/L (ref 31–125)
BUN: 12 mg/dL (ref 7–25)
CO2: 27 mmol/L (ref 20–32)
Calcium: 10.1 mg/dL (ref 8.6–10.2)
Chloride: 103 mmol/L (ref 98–110)
Creat: 0.67 mg/dL (ref 0.50–1.10)
GFR, Est African American: 131 mL/min/{1.73_m2} (ref >=60)
GFR, Est Non African American: 113 mL/min/{1.73_m2} (ref >=60)
Globulin: 2.8 g/dL (calc) (ref 1.9–3.7)
Glucose, Bld: 81 mg/dL (ref 65–99)
Potassium: 4.2 mmol/L (ref 3.5–5.3)
Sodium: 139 mmol/L (ref 135–146)
Total Bilirubin: 0.4 mg/dL (ref 0.2–1.2)
Total Protein: 7.4 g/dL (ref 6.1–8.1)

## 2020-10-25 LAB — TSH: TSH: 2.26 mIU/L

## 2020-10-25 LAB — FERRITIN: Ferritin: 11 ng/mL — ABNORMAL LOW (ref 16–154)

## 2020-10-25 NOTE — Progress Notes (Signed)
Melissa Mcclain,   Normal kidney, liver, glucose.  Thyroid normal.  No anemia but your iron stores are low. Make sure eating iron rich foods and little iron in your daily MVI.  LDL, bad cholesterol is really high. I continue to recommend a statin. I think your elevated cholesterol is genetic and not diet related. Starting a statin decreases plaque accumulation and CV risk. Thoughts?

## 2020-10-26 ENCOUNTER — Encounter: Payer: Self-pay | Admitting: Physician Assistant

## 2020-10-26 DIAGNOSIS — F411 Generalized anxiety disorder: Secondary | ICD-10-CM | POA: Insufficient documentation

## 2020-10-26 LAB — CYTOLOGY - PAP
Comment: NEGATIVE
Diagnosis: NEGATIVE
High risk HPV: NEGATIVE

## 2020-10-26 NOTE — Progress Notes (Signed)
Nebulizer rx was sent on 2/23. [place] for written rx if she needs? Is she going to pick up?

## 2020-10-26 NOTE — Telephone Encounter (Signed)
Spoke with patient right before 12 pm and she was going to wait on [facility] for nebulizer. This was sent before our conversation. FYI.

## 2020-10-26 NOTE — Telephone Encounter (Signed)
Ok thanks 

## 2020-10-26 NOTE — Telephone Encounter (Signed)
Reprint the DME and nebulizer solution for patient?

## 2020-10-26 NOTE — Progress Notes (Signed)
Normal cells and negative for HPV great news. Ok to retest in 3-5 years.

## 2021-02-18 ENCOUNTER — Encounter: Payer: Self-pay | Admitting: Physician Assistant

## 2021-02-18 ENCOUNTER — Telehealth (INDEPENDENT_AMBULATORY_CARE_PROVIDER_SITE_OTHER): Payer: Self-pay | Admitting: Physician Assistant

## 2021-02-18 DIAGNOSIS — J4541 Moderate persistent asthma with (acute) exacerbation: Secondary | ICD-10-CM

## 2021-02-18 DIAGNOSIS — J45909 Unspecified asthma, uncomplicated: Secondary | ICD-10-CM | POA: Insufficient documentation

## 2021-02-18 DIAGNOSIS — J0140 Acute pansinusitis, unspecified: Secondary | ICD-10-CM

## 2021-02-18 MED ORDER — HYDROCODONE BIT-HOMATROP MBR 5-1.5 MG/5ML PO SOLN: 5.0000 mL | Freq: Three times a day (TID) | ORAL | 0 refills | Status: DC | PRN

## 2021-02-18 MED ORDER — PREDNISONE 50 MG PO TABS: ORAL_TABLET | ORAL | 0 refills | Status: DC

## 2021-02-18 MED ORDER — AZITHROMYCIN 250 MG PO TABS: ORAL_TABLET | ORAL | 0 refills | Status: DC

## 2021-02-18 NOTE — Progress Notes (Signed)
Started 02/09/2021 Congestion Sore throat  Cough (can't sleep, causing vomiting, burst blood vessel in eye)  Rash (flank/leg)  Taking: Mucinex , benadryl Prednisone (had some left over and took two tablets) 50 mg

## 2021-02-18 NOTE — Progress Notes (Signed)
Patient ID: Melissa Mcclain, female   DOB: 01/01/1986, 35 y.o.   MRN: [id number] .Marland KitchenVirtual Visit via Video Note  I connected with Melissa Mcclain Mcclain on 02/18/21 at  8:50 AM EDT by a video enabled telemedicine application and verified that I am speaking with the correct person using two identifiers.  Location: Patient: home Provider: clinic  .Marland KitchenParticipating in visit:  Patient: Melissa Mcclain Mcclain Provider: Iran Planas PA-C   I discussed the limitations of evaluation and management by telemedicine and the availability of in person appointments. The patient expressed understanding and agreed to proceed.  History of Present Illness: Patient is a 35 year old female with asthma and allergies who presents to the clinic with 10 days of cough, sore throat, congestion, sinus pressure, headache.  Her son is also sick with similar symptoms but he has improved.  He was negative for COVID but she is not tested.  She is taking Mucinex, Benadryl, Flonase and she took 2 expired prednisone. She is feeling some better but still so much congestion and coughing. The cough is keeping her up at night, caused a hemorrhage in eye and vomitted once. She is vaccinated x3 from covid.    .. 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   Status post vacuum-assisted vaginal delivery 08/22/2015   Winged scapula, right 12/17/2015   Nummular eczema 01/23/2016   No energy 01/09/2017   Irritable bowel syndrome with diarrhea 01/09/2017   Acute left-sided low back pain without sciatica 06/12/2017   Panic attacks 01/12/2018   Trouble in sleeping 01/12/2018   Vitamin D deficiency 06/28/2019   Low ferritin  07/04/2019   Corn of toe 08/17/2020   GAD (generalized anxiety disorder) 10/26/2020   Asthmatic bronchitis 02/18/2021   Resolved Ambulatory Problems    Diagnosis Date Noted   Pregnant 12/17/2014   Supervision of normal first pregnancy 01/18/2015   Scabies 03/12/2015   Cerumen impaction 03/12/2015   [redacted] weeks gestation of pregnancy    Choroid plexus cyst of fetus    Encounter for supervision of normal first pregnancy in second trimester    Active labor at term 08/19/2015   Obstetrical laceration, second degree 08/22/2015   Past Medical History:  Diagnosis Date   Asthma    Heart murmur    Reviewed med, allergy, problem list.  Observations/Objective: No acute distress No labored breathing Dry hacking cough and congested sounding  .Marland Kitchen Today's Vitals   02/18/21 0841  Temp: 98.9 F (37.2 C)  TempSrc: Oral  Weight: 125 lb (56.7 kg)  Height: 5\' 2"  (1.575 m)   Body mass index is 22.86 kg/m.    Assessment and Plan: Marland KitchenMarland KitchenMelissa was seen today for sore throat.  Diagnoses and all orders for this visit:  Moderate persistent asthmatic bronchitis with acute exacerbation -     HYDROcodone bit-homatropine (HYCODAN) 5-1.5 MG/5ML syrup; Take 5 mLs by mouth every 8 (eight) hours as needed for cough. -     predniSONE (DELTASONE) 50 MG tablet; One tab PO daily for 5 days.  Acute non-recurrent pansinusitis -     azithromycin (ZITHROMAX Z-PAK) 250 MG tablet; Take 2 tablets (500 mg) on  Day 1,  followed by 1 tablet (250 mg) once daily on Days 2 through 5. -  predniSONE (DELTASONE) 50 MG tablet; One tab PO daily for 5 days.  Sinobronchitis treated with zpak and prednisone burst. Continue asthma prevention with singulair, xyzal, albuterol and symbicort. Symptomatic care discussed. Follow up as needed or if symptoms persist.    Follow Up Instructions:    I discussed the assessment and treatment plan with the patient. The patient was provided an opportunity to ask questions and all were  answered. The patient agreed with the plan and demonstrated an understanding of the instructions.   The patient was advised to call back or seek an in-person evaluation if the symptoms worsen or if the condition fails to improve as anticipated.    Iran Planas, PA-C

## 2021-03-01 ENCOUNTER — Telehealth: Payer: Self-pay | Admitting: *Deleted

## 2021-03-01 DIAGNOSIS — R058 Other specified cough: Secondary | ICD-10-CM

## 2021-03-01 DIAGNOSIS — R053 Chronic cough: Secondary | ICD-10-CM

## 2021-03-01 MED ORDER — BENZONATATE 200 MG PO CAPS: 200.0000 mg | ORAL_CAPSULE | Freq: Two times a day (BID) | ORAL | 0 refills | Status: DC | PRN

## 2021-03-01 NOTE — Telephone Encounter (Signed)
Pt called asking for refill of tessalon pearls. These helped her cough previously. Sent rx to pharmacy

## 2021-03-20 ENCOUNTER — Telehealth (INDEPENDENT_AMBULATORY_CARE_PROVIDER_SITE_OTHER): Payer: Self-pay | Admitting: Family Medicine

## 2021-03-20 ENCOUNTER — Encounter: Payer: Self-pay | Admitting: Family Medicine

## 2021-03-20 DIAGNOSIS — J45991 Cough variant asthma: Secondary | ICD-10-CM

## 2021-03-20 DIAGNOSIS — R053 Chronic cough: Secondary | ICD-10-CM

## 2021-03-20 DIAGNOSIS — R058 Other specified cough: Secondary | ICD-10-CM

## 2021-03-20 MED ORDER — BENZONATATE 200 MG PO CAPS: 200.0000 mg | ORAL_CAPSULE | Freq: Two times a day (BID) | ORAL | 1 refills | Status: DC | PRN

## 2021-03-20 MED ORDER — PREDNISONE 50 MG PO TABS: ORAL_TABLET | ORAL | 0 refills | Status: DC

## 2021-03-21 NOTE — Assessment & Plan Note (Signed)
Continue Symbicort daily with albuterol as needed.  Renewal of Tessalon sent in. Muscular send in a course of prednisone as she has had improvement with this previously as well.  She will let us know if her symptoms are not improving with this.

## 2021-03-21 NOTE — Progress Notes (Signed)
Melissa Mcclain - 35 y.o. female MRN [id number]  Date of birth: 08-Aug-1986   This visit type was conducted due to national recommendations for restrictions regarding the COVID-19 Pandemic (e.g. social distancing).  This format is felt to be most appropriate for this patient at this time.  All issues noted in this document were discussed and addressed.  No physical exam was performed (except for noted visual exam findings with Video Visits).  I discussed the limitations of evaluation and management by telemedicine and the availability of in person appointments. The patient expressed understanding and agreed to proceed.  I connected withNAME@ on 03/21/21 at  2:50 PM EDT by a video enabled telemedicine application and verified that I am speaking with the correct person using two identifiers.  Present at visit: Melissa Nutting, DO [name]   Patient Location: Home [place] [address]   Provider location:   [place] Complaint  Patient presents with   Cough    HPI  Melissa Mcclain is a 35 y.o. female who presents via audio/video conferencing for a telehealth visit today.  She has complaint today of exacerbation of chronic cough.  She does have history of cough variant asthma.  She typically takes Tessalon as needed for cough however she needs a renewal of this.  She is using Symbicort daily with albuterol as needed.  She does often get improvement with a course of steroids as well.  She does have some occasional wheezing but denies shortness of breath at this time.  She has not had any fever, chills or body aches.   ROS:  A comprehensive ROS was completed and negative except as noted per HPI  Past Medical History:  Diagnosis Date   Asthma    Heart murmur     Past Surgical History:  Procedure Laterality Date   HERNIA REPAIR  2006,2007   TONSILECTOMY/ADENOIDECTOMY WITH MYRINGOTOMY  1996    Family History  Problem Relation Age of Onset   Hyperlipidemia  Mother    Alcohol abuse Father    Depression Brother    Alcohol abuse Maternal Aunt    Alcohol abuse Maternal Uncle    Alcohol abuse Paternal Aunt    Hyperlipidemia Maternal Grandmother    Alcohol abuse Maternal Grandfather    Cancer Maternal Grandfather    Cancer Paternal Grandmother    Alcohol abuse Paternal Aunt     Social History   Socioeconomic History   Marital status: Married    Spouse name: Not on file   Number of children: Not on file   Years of education: Not on file   Highest education level: Not on file  Occupational History   Not on file  Tobacco Use   Smoking status: Never   Smokeless tobacco: Never  Substance and Sexual Activity   Alcohol use: Yes    Alcohol/week: 0.0 standard drinks   Drug use: No   Sexual activity: Yes    Birth control/protection: None  Other Topics Concern   Not on file  Social History Narrative   Not on file   Social Determinants of Health   Financial Resource Strain: Not on file  Food Insecurity: Not on file  Transportation Needs: Not on file  Physical Activity: Not on file  Stress: Not on file  Social Connections: Not on file  Intimate Partner Violence: Not on file     Current Outpatient Medications:    albuterol (PROVENTIL) (2.5 MG/3ML) 0.083% nebulizer solution, Take 3 mLs (2.5 mg total)  by nebulization every 4 (four) hours as needed for wheezing or shortness of breath (please include nebulizer machine, hoses, and mask if needed.)., Disp: 100 mL, Rfl: 0   budesonide-formoterol (SYMBICORT) 160-4.5 MCG/ACT inhaler, Inhale 2 puffs into the lungs 2 (two) times daily., Disp: 1 each, Rfl: 3   ergocalciferol (VITAMIN D2) 1.25 MG (50000 UT) capsule, Take 1 capsule (50,000 Units total) by mouth once a week., Disp: 12 capsule, Rfl: 3   predniSONE (DELTASONE) 50 MG tablet, Take 1 tab po daily x5 days, Disp: 5 tablet, Rfl: 0   sertraline (ZOLOFT) 50 MG tablet, Take 1 tablet (50 mg total) by mouth daily., Disp: 90 tablet, Rfl: 3    benzonatate (TESSALON) 200 MG capsule, Take 1 capsule (200 mg total) by mouth 2 (two) times daily as needed for cough., Disp: 30 capsule, Rfl: 1  EXAM:  VITALS per patient if applicable: Temp 69.6 F (36.6 C)   Ht 5\' 2"  (1.575 m)   Wt 125 lb (56.7 kg)   BMI 22.86 kg/m   GENERAL: alert, oriented, appears well and in no acute distress  HEENT: atraumatic, conjunttiva clear, no obvious abnormalities on inspection of external nose and ears  NECK: normal movements of the head and neck  LUNGS: on inspection no signs of respiratory distress, breathing rate appears normal, no obvious gross SOB, gasping or wheezing  CV: no obvious cyanosis  MS: moves all visible extremities without noticeable abnormality  PSYCH/NEURO: pleasant and cooperative, no obvious depression or anxiety, speech and thought processing grossly intact  ASSESSMENT AND PLAN:  Discussed the following assessment and plan:  Asthma, cough variant Continue Symbicort daily with albuterol as needed.  Renewal of Tessalon sent in. Muscular send in a course of prednisone as she has had improvement with this previously as well.  She will let us know if her symptoms are not improving with this.     I discussed the assessment and treatment plan with the patient. The patient was provided an opportunity to ask questions and all were answered. The patient agreed with the plan and demonstrated an understanding of the instructions.   The patient was advised to call back or seek an in-person evaluation if the symptoms worsen or if the condition fails to improve as anticipated.    Melissa Nutting, DO

## 2021-04-08 ENCOUNTER — Telehealth: Payer: Self-pay | Admitting: Neurology

## 2021-04-08 NOTE — Telephone Encounter (Signed)
Patient left vm asking for a note to be excused from jury duty due to severe social anxiety. Please advise if okay to write.

## 2021-04-09 NOTE — Telephone Encounter (Signed)
Letter written, at front for pick up. Patient made aware.

## 2021-07-11 ENCOUNTER — Other Ambulatory Visit: Payer: Self-pay | Admitting: Neurology

## 2021-07-11 DIAGNOSIS — F3281 Premenstrual dysphoric disorder: Secondary | ICD-10-CM

## 2021-07-11 DIAGNOSIS — F410 Panic disorder [episodic paroxysmal anxiety] without agoraphobia: Secondary | ICD-10-CM

## 2021-07-11 DIAGNOSIS — F32A Depression, unspecified: Secondary | ICD-10-CM

## 2021-07-11 DIAGNOSIS — F419 Anxiety disorder, unspecified: Secondary | ICD-10-CM

## 2021-07-11 MED ORDER — SERTRALINE HCL 50 MG PO TABS: 50.0000 mg | ORAL_TABLET | Freq: Every day | ORAL | 0 refills | Status: DC

## 2021-08-02 ENCOUNTER — Telehealth: Payer: Self-pay

## 2021-08-02 NOTE — Telephone Encounter (Signed)
Pt called and said that she just tested positive for COVID. She is [redacted] weeks pregnant and would like to know what she can do to help with her sx. She said that the knows she is supposed to take Tylenol instead of ibuprofen but wants to know what else she can use.

## 2021-08-02 NOTE — Telephone Encounter (Signed)
Tylenol and delsym. Lots of fluids and rest. Watch for blood clot signs and symptoms since at an increased risk. Vitamin C 500mg  zinc 50mg  vitamin D 5000 units.

## 2021-08-02 NOTE — Telephone Encounter (Signed)
Pt aware of recommendations. No further questions or concerns at this time.

## 2021-08-05 ENCOUNTER — Telehealth: Payer: Self-pay

## 2021-08-05 ENCOUNTER — Encounter: Payer: Self-pay | Admitting: Obstetrics & Gynecology

## 2021-08-05 NOTE — Telephone Encounter (Signed)
Start B6 25mg  three times a day and unisom 12.5-25 in the evening. Keep eating applesauce and maybe try some crackers.

## 2021-08-05 NOTE — Telephone Encounter (Signed)
Pt's spouse calls stating that pt is approximately [redacted] weeks pregnant and is experiencing nausea and vomiting all day, every day.  He states that she vomits approximately twice daily.  About the only thing that she is able to keep down is applesauce.  He would like to know if there's anything else that the patient can do to help with this.  Please advise.  Charyl Bigger, CMA

## 2021-08-05 NOTE — Telephone Encounter (Signed)
Pt's spouse informed of recommendations.  Spouse expressed understanding.  Charyl Bigger, CMA

## 2021-08-12 ENCOUNTER — Encounter: Payer: Self-pay | Admitting: Obstetrics & Gynecology

## 2021-08-12 ENCOUNTER — Other Ambulatory Visit: Payer: Self-pay

## 2021-08-12 ENCOUNTER — Other Ambulatory Visit (HOSPITAL_COMMUNITY)
Admission: RE | Admit: 2021-08-12 | Discharge: 2021-08-12 | Disposition: A | Discharge status: Home / Self Care | Payer: Self-pay | Source: Ambulatory Visit | Attending: Obstetrics & Gynecology | Admitting: Obstetrics & Gynecology

## 2021-08-12 ENCOUNTER — Ambulatory Visit (INDEPENDENT_AMBULATORY_CARE_PROVIDER_SITE_OTHER): Payer: Self-pay | Admitting: Obstetrics & Gynecology

## 2021-08-12 VITALS — BP 115/74 | HR 110 | Wt 120.0 lb

## 2021-08-12 DIAGNOSIS — O09521 Supervision of elderly multigravida, first trimester: Secondary | ICD-10-CM

## 2021-08-12 DIAGNOSIS — O99341 Other mental disorders complicating pregnancy, first trimester: Secondary | ICD-10-CM

## 2021-08-12 DIAGNOSIS — F419 Anxiety disorder, unspecified: Secondary | ICD-10-CM

## 2021-08-12 DIAGNOSIS — E559 Vitamin D deficiency, unspecified: Secondary | ICD-10-CM

## 2021-08-12 DIAGNOSIS — Z23 Encounter for immunization: Secondary | ICD-10-CM

## 2021-08-12 DIAGNOSIS — O09529 Supervision of elderly multigravida, unspecified trimester: Secondary | ICD-10-CM

## 2021-08-12 DIAGNOSIS — Z3490 Encounter for supervision of normal pregnancy, unspecified, unspecified trimester: Secondary | ICD-10-CM | POA: Insufficient documentation

## 2021-08-12 DIAGNOSIS — Z3A11 11 weeks gestation of pregnancy: Secondary | ICD-10-CM

## 2021-08-12 DIAGNOSIS — R790 Abnormal level of blood mineral: Secondary | ICD-10-CM

## 2021-08-12 LAB — HEPATITIS C ANTIBODY: HCV Ab: NEGATIVE

## 2021-08-12 MED ORDER — ONDANSETRON HCL 4 MG PO TABS: 4.0000 mg | ORAL_TABLET | Freq: Three times a day (TID) | ORAL | 0 refills | Status: DC | PRN

## 2021-08-12 NOTE — Progress Notes (Signed)
Subjective:    Melissa Mcclain is a G2P1001 [redacted]w[redacted]d being seen today for her first obstetrical visit.  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   Patient reports nausea and vomiting.  Vitals:   08/12/21 0957  BP: 115/74  Pulse: (!) 110  Weight: 120 lb (54.4 kg)    HISTORY: OB History  Gravida Para Term Preterm AB Living  2 1 1     1   SAB IAB Ectopic Multiple Live Births        0 1    # Outcome Date GA Lbr Len/2nd Weight Sex Delivery Anes PTL Lv  2 Current           1 Term 08/20/15 [redacted]w[redacted]d 25:43 / 04:10 8 lb 0.2 oz (3.635 kg) M Vag-Vacuum EPI  LIV     Birth Comments: [id number]   Past Medical History:  Diagnosis Date   Asthma    Heart murmur    Past Surgical History:  Procedure Laterality Date   HERNIA REPAIR  2006,2007   TONSILECTOMY/ADENOIDECTOMY WITH MYRINGOTOMY  1996   Family History  Problem Relation Age of Onset   Hyperlipidemia Mother    Alcohol abuse Father    Depression Brother    Alcohol abuse Maternal Aunt    Alcohol abuse Maternal Uncle    Alcohol abuse Paternal Aunt    Hyperlipidemia Maternal Grandmother    Alcohol abuse Maternal Grandfather    Cancer Maternal Grandfather    Cancer Paternal Grandmother    Alcohol abuse Paternal Aunt      Exam    Uterus:     Pelvic Exam:    Perineum: No Hemorrhoids   Vulva: normal   Vagina:  normal mucosa   pH: N/a   Cervix: Small endocervical polyp     Adnexa: normal adnexa   Bony Pelvis: average  System: Breast:  normal appearance, no masses or tenderness   Skin: normal coloration and turgor, no rashes    Neurologic: oriented, normal mood   Extremities: normal strength, tone, and muscle mass   HEENT oropharynx clear, no lesions, neck supple with midline trachea, and thyroid without masses   Mouth/Teeth mucous membranes moist, pharynx normal without lesions   Neck supple and no masses   Cardiovascular: regular rate and rhythm   Respiratory:  appears well,  vitals normal, no respiratory distress, acyanotic, normal RR, neck free of mass or lymphadenopathy, chest clear, no wheezing, crepitations, rhonchi, normal symmetric air entry   Abdomen: soft, non-tender; bowel sounds normal; no masses,  no organomegaly   Urinary: urethral meatus normal      Assessment:    Pregnancy: G2P1001 Patient Active Problem List   Diagnosis Date Noted   Asthmatic bronchitis 02/18/2021   GAD (generalized anxiety disorder) 10/26/2020   Corn of toe 08/17/2020   Low ferritin 07/04/2019   Vitamin D deficiency 06/28/2019   Panic attacks 01/12/2018   Trouble in sleeping 01/12/2018   Acute left-sided low back pain without sciatica 06/12/2017   No energy 01/09/2017   Irritable bowel syndrome with diarrhea 01/09/2017   Nummular eczema 01/23/2016   Winged scapula, right 12/17/2015   Status post vacuum-assisted vaginal delivery 08/22/2015   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 Prenatal vitamins. Problem list reviewed and updated. Genetic Screening discussed:  Considering NIPS (pt is self pay); she will let us know; getting quote for cash pay  Ultrasound discussed; fetal survey: requested.  Follow up in 6 weeks. Baby Rx optimized schedule--cuff given  Depression / Anxiety--> patient self discontinued her Zoloft.  She did not feel like it was helping much with her anxiety and depression.  She is comfortable with her decision as she did a literature review.  Patient is aware that uncontrolled anxiety and depression can lead to adverse prenatal outcomes and to let us know if she is having worsening depression and anxiety. Patient has not  nausea for the past couple weeks.  She has vomited some including today.  We discussed Zofran, B6 with Unisom, and Phenergan.  Patient would like to do Zofran during the day.  She will use Colace and MiraLAX to avoid constipation.  She will try the B6 and Unisom at night.  Patient does not have health insurance so we are using generic OTCs. Patient contemplating getting nips.  She will discuss with her husband and let us know. Patient would like an ultrasound and this will be ordered between 18 and 20 weeks Patient would like flu shot which was given today.  Patient does not want her husband to know that she was vaccinated.  She is planning on getting the COVID booster--should wait 90 days after last infection (which was 2 weeks ago) Ferritin was low at last visit with PCP.  Will see what her CBC is today and recheck ferritin. Also adding on Vit D as she is low in past.     Silas Sacramento 08/12/2021

## 2021-08-12 NOTE — Progress Notes (Signed)
Bedside U/S shows single IUP with FHT of 163 BPM and CRL measures 48.06 mm  GA [redacted]w[redacted]d

## 2021-08-13 LAB — GC/CHLAMYDIA PROBE AMP (~~LOC~~) NOT AT ARMC
Chlamydia: NEGATIVE
Comment: NEGATIVE
Comment: NORMAL
Neisseria Gonorrhea: NEGATIVE

## 2021-08-14 LAB — OBSTETRIC PANEL
Absolute Monocytes: 624 cells/uL (ref 200–950)
Antibody Screen: NOT DETECTED
Basophils Absolute: 19 cells/uL (ref 0–200)
Basophils Relative: 0.2 %
Eosinophils Absolute: 19 cells/uL (ref 15–500)
Eosinophils Relative: 0.2 %
HCT: 38 % (ref 35.0–45.0)
Hemoglobin: 13 g/dL (ref 11.7–15.5)
Hepatitis B Surface Ag: NONREACTIVE
Lymphs Abs: 1546 cells/uL (ref 850–3900)
MCH: 31 pg (ref 27.0–33.0)
MCHC: 34.2 g/dL (ref 32.0–36.0)
MCV: 90.5 fL (ref 80.0–100.0)
MPV: 11.8 fL (ref 7.5–12.5)
Monocytes Relative: 6.5 %
Neutro Abs: 7392 cells/uL (ref 1500–7800)
Neutrophils Relative %: 77 %
Platelets: 272 10*3/uL (ref 140–400)
RBC: 4.2 10*6/uL (ref 3.80–5.10)
RDW: 12.7 % (ref 11.0–15.0)
RPR Ser Ql: NONREACTIVE
Rubella: 7.16 Index
Total Lymphocyte: 16.1 %
WBC: 9.6 10*3/uL (ref 3.8–10.8)

## 2021-08-14 LAB — HEPATITIS C ANTIBODY
Hepatitis C Ab: NONREACTIVE
SIGNAL TO CUT-OFF: 0.07 (ref <1.00)

## 2021-08-14 LAB — FERRITIN: Ferritin: 32 ng/mL (ref 16–154)

## 2021-08-14 LAB — HIV ANTIBODY (ROUTINE TESTING W REFLEX): HIV 1&2 Ab, 4th Generation: NONREACTIVE

## 2021-08-14 LAB — TEST AUTHORIZATION

## 2021-08-14 LAB — URINE CULTURE, OB REFLEX: Organism ID, Bacteria: NO GROWTH

## 2021-08-14 LAB — VITAMIN D 25 HYDROXY (VIT D DEFICIENCY, FRACTURES): Vit D, 25-Hydroxy: 18 ng/mL — ABNORMAL LOW (ref 30–100)

## 2021-08-14 LAB — CULTURE, OB URINE

## 2021-08-15 ENCOUNTER — Other Ambulatory Visit: Payer: Self-pay | Admitting: Physician Assistant

## 2021-08-15 DIAGNOSIS — F3281 Premenstrual dysphoric disorder: Secondary | ICD-10-CM

## 2021-08-15 DIAGNOSIS — F410 Panic disorder [episodic paroxysmal anxiety] without agoraphobia: Secondary | ICD-10-CM

## 2021-08-15 DIAGNOSIS — F32A Depression, unspecified: Secondary | ICD-10-CM

## 2021-08-29 ENCOUNTER — Encounter: Payer: Self-pay | Admitting: Obstetrics and Gynecology

## 2021-09-01 NOTE — L&D Delivery Note (Signed)
[facility] Delivery Note  Goldye Tourangeau is a 36 y.o. [id number] s/p SVD at [redacted]w[redacted]d She was admitted for SROM and spontaneous labor.   ROM: 10h 063mith yellow stained fluid GBS Status: negative Maximum Maternal Temperature: 98.1  Labor Progress: Presented in active labor and SROM and progressed to complete with expectant management  Delivery Date/Time: [id number]: Called to room and patient was complete and pushing. Head delivered OA. No nuchal cord present. Shoulder and body delivered in usual fashion. Infant with spontaneous cry, placed on mother's abdomen, dried and stimulated. Cord clamped x 2 immediately as fetus with very weak cry/grunt and cut by father of baby under my direct supervision . Cord blood drawn. Placenta delivered spontaneously with gentle cord traction. Fundus firm with massage and Pitocin. Labia, perineum, vagina, and cervix inspected and found to have a 2nd degree perineal laceration which was repaired with 3-0 vicryl  Placenta: intact, 3V cord, to L&D Complications: none Lacerations: 2nd degree EBL: 75cc Analgesia:Epidural  Infant: female  APGARs 7,46,83800g  AnRenard MatterMD, MPH OB Fellow, [employer]

## 2021-10-14 ENCOUNTER — Ambulatory Visit (INDEPENDENT_AMBULATORY_CARE_PROVIDER_SITE_OTHER): Payer: 59 | Admitting: Obstetrics & Gynecology

## 2021-10-14 ENCOUNTER — Other Ambulatory Visit: Payer: Self-pay

## 2021-10-14 VITALS — BP 104/65 | HR 99 | Wt 124.4 lb

## 2021-10-14 DIAGNOSIS — F419 Anxiety disorder, unspecified: Secondary | ICD-10-CM

## 2021-10-14 DIAGNOSIS — O09529 Supervision of elderly multigravida, unspecified trimester: Secondary | ICD-10-CM

## 2021-10-14 DIAGNOSIS — F32A Depression, unspecified: Secondary | ICD-10-CM

## 2021-10-14 DIAGNOSIS — E559 Vitamin D deficiency, unspecified: Secondary | ICD-10-CM

## 2021-10-14 DIAGNOSIS — J302 Other seasonal allergic rhinitis: Secondary | ICD-10-CM

## 2021-10-14 MED ORDER — VITAMIN D2 50 MCG (2000 UT) PO TABS
1.0000 | ORAL_TABLET | Freq: Every day | ORAL | 8 refills | Status: DC
Start: 2021-10-14 — End: 2022-01-21

## 2021-10-14 NOTE — Progress Notes (Signed)
ROB 20 wks Reports episode of low abd cramping awakened in AM, resolved Reports problem with nasal congestion and bloody nose    PRENATAL VISIT NOTE  Subjective:  Melissa Mcclain is a 36 y.o. G2P1001 at [redacted]w[redacted]d being seen today for ongoing prenatal care.  She is currently monitored for the following issues for this low-risk pregnancy and has Seasonal allergies; Asthma, cough variant; PMDD (premenstrual dysphoric disorder); Anxiety and depression; Hyperlipidemia; Cystic fibrosis carrier; Status post vacuum-assisted vaginal delivery; Panic attacks; Vitamin D deficiency; Low ferritin; Corn of toe; GAD (generalized anxiety disorder); and Encounter for supervision of high-risk pregnancy with multigravida of advanced maternal age on their problem list.  Patient reports  runny nose and some nose bleeds .  Contractions: Not present. Vag. Bleeding: None.  Movement: Present. Denies leaking of fluid.   The following portions of the patient's history were reviewed and updated as appropriate: allergies, current medications, past family history, past medical history, past social history, past surgical history and problem list.   Objective:   Vitals:   10/14/21 0948  BP: 104/65  Pulse: 99  Weight: 124 lb 6.4 oz (56.4 kg)    Fetal Status: Fetal Heart Rate (bpm): 151   Movement: Present     General:  Alert, oriented and cooperative. Patient is in no acute distress.  Skin: Skin is warm and dry. No rash noted.   Cardiovascular: Normal heart rate noted  Respiratory: Normal respiratory effort, no problems with respiration noted  Abdomen: Soft, gravid, appropriate for gestational age.  Pain/Pressure: Absent     Pelvic: Cervical exam deferred        Extremities: Normal range of motion.  Edema: None  Mental Status: Normal mood and affect. Normal behavior. Normal judgment and thought content.   Assessment and Plan:  Pregnancy: G2P1001 at [redacted]w[redacted]d 1. Encounter for supervision of high-risk pregnancy with  multigravida of advanced maternal age - [place] MFM OB DETAIL +14 [place]; Future Patient is taking blood pressure weekly and they are all normal.  Now that she has stopped having vomiting, I expect her weight gain to improve.  2. Seasonal allergies Start over-the-counter Allegra or Zyrtec.  3. Vitamin D deficiency 2000 IU prescribed  4. Anxiety and depression Pt reports no issues.   Preterm labor symptoms and general obstetric precautions including but not limited to vaginal bleeding, contractions, leaking of fluid and fetal movement were reviewed in detail with the patient. Please refer to After Visit Summary for other counseling recommendations.   Return in about 8 weeks (around 12/09/2021).  Future Appointments  Date Time Provider [place]  10/30/2021 12:30 PM [facility] NURSE [facility]  10/30/2021 12:45 PM [facility] US6 [facility] [place]    Silas Sacramento, MD

## 2021-10-30 ENCOUNTER — Ambulatory Visit: Payer: 59 | Attending: Obstetrics & Gynecology

## 2021-10-30 ENCOUNTER — Ambulatory Visit: Payer: 59 | Admitting: *Deleted

## 2021-10-30 ENCOUNTER — Other Ambulatory Visit: Payer: Self-pay

## 2021-10-30 ENCOUNTER — Other Ambulatory Visit: Payer: Self-pay | Admitting: *Deleted

## 2021-10-30 VITALS — BP 111/64 | HR 104

## 2021-10-30 DIAGNOSIS — O09529 Supervision of elderly multigravida, unspecified trimester: Secondary | ICD-10-CM | POA: Diagnosis not present

## 2021-10-30 DIAGNOSIS — O09892 Supervision of other high risk pregnancies, second trimester: Secondary | ICD-10-CM

## 2021-10-30 DIAGNOSIS — O09522 Supervision of elderly multigravida, second trimester: Secondary | ICD-10-CM | POA: Insufficient documentation

## 2021-11-25 ENCOUNTER — Telehealth: Payer: Self-pay | Admitting: *Deleted

## 2021-11-25 NOTE — Telephone Encounter (Signed)
Patient given numbers to billing offices. ?

## 2021-11-25 NOTE — Telephone Encounter (Signed)
-----   Message from Lyndal Rainbow, [place] sent at 11/25/2021  2:41 PM EDT ----- ?Regarding: Scientific laboratory technician Scan ?Franne Grip, ? ?This pt's husband called stating that the insurance company was needing information to cover the anatomy scan. I'm unsure if they needed prior authorization or what. He seemed confused about it. I told him the insurance company would need to reach out to [place]. I told him I would let you know and you would work on it when you had free time. Please follow up with pt/insurance company.  ? ?Thank you  ? ?

## 2021-12-04 ENCOUNTER — Encounter: Payer: Self-pay | Admitting: Physician Assistant

## 2021-12-05 ENCOUNTER — Telehealth (INDEPENDENT_AMBULATORY_CARE_PROVIDER_SITE_OTHER): Payer: 59 | Admitting: Physician Assistant

## 2021-12-05 ENCOUNTER — Encounter: Payer: Self-pay | Admitting: Physician Assistant

## 2021-12-05 VITALS — BP 109/66 | HR 109 | Temp 98.5°F | Ht 62.0 in | Wt 124.0 lb

## 2021-12-05 DIAGNOSIS — L241 Irritant contact dermatitis due to oils and greases: Secondary | ICD-10-CM

## 2021-12-05 MED ORDER — PREDNISONE 50 MG PO TABS: ORAL_TABLET | ORAL | 0 refills | Status: DC

## 2021-12-05 NOTE — Progress Notes (Signed)
..  Virtual Visit via Telephone Note ? ?I connected with Melissa Mcclain on 12/05/21 at  1:00 PM EDT by telephone and verified that I am speaking with the correct person using two identifiers. ? ?Location: ?Patient: home ?Provider: clinic ? ?Marland Kitchen.Participating in visit:  ?Patient: Melissa Mcclain ?Provider: Iran Planas PA-C ?  ?I discussed the limitations, risks, security and privacy concerns of performing an evaluation and management service by telephone and the availability of in person appointments. I also discussed with the patient that there may be a patient responsible charge related to this service. The patient expressed understanding and agreed to proceed. ? ? ?History of Present Illness: ?Pt is a 36 yo pregnant female who has itchy contact dermatitis rash with poison oak/ivy for the last few days. She has tried OTC medications and miserable. No fever, chills. She can't sleep at night.  ? ?.. ?Active Ambulatory Problems  ?  Diagnosis Date Noted  ? Seasonal allergies 08/16/2014  ? Asthma, cough variant 08/16/2014  ? PMDD (premenstrual dysphoric disorder) 08/16/2014  ? Anxiety and depression 08/16/2014  ? Hyperlipidemia 08/16/2014  ? 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 Status post vacuum-assisted vaginal delivery 08/22/2015  ? Panic attacks 01/12/2018  ? Vitamin D deficiency 06/28/2019  ? Low ferritin 07/04/2019  ? Corn of toe 08/17/2020  ? GAD (generalized anxiety disorder) 10/26/2020  ? Encounter for supervision of high-risk pregnancy with multigravida of advanced maternal age 20/08/2021  ? ?Resolved Ambulatory Problems  ?  Diagnosis Date Noted  ? IDA (iron deficiency anemia) 08/16/2014  ? Gastroesophageal reflux disease without esophagitis 08/16/2014  ? Pregnant 12/17/2014  ? Supervision of normal first pregnancy 01/18/2015  ? Scabies 03/12/2015  ? Cerumen impaction 03/12/2015  ? [redacted] weeks gestation of pregnancy   ? Choroid plexus cyst of fetus   ? Encounter for supervision of normal first pregnancy in second  trimester   ? Active labor at term 08/19/2015  ? Obstetrical laceration, second degree 08/22/2015  ? Winged scapula, right 12/17/2015  ? Nummular eczema 01/23/2016  ? No energy 01/09/2017  ? Irritable bowel syndrome with diarrhea 01/09/2017  ? Acute left-sided low back pain without sciatica 06/12/2017  ? Trouble in sleeping 01/12/2018  ? Asthmatic bronchitis 02/18/2021  ? ?Past Medical History:  ?Diagnosis Date  ? Asthma   ? Heart murmur   ? ? ? ?  ?Observations/Objective: ?See pictures in mychart of vesicular erythematous linear rash on bilateral arms ? ? ?Assessment and Plan: ?..Melissa Mcclain was seen today for rash. ? ?Diagnoses and all orders for this visit: ? ?Contact dermatitis due to oil, unspecified contact dermatitis type ?-     predniSONE (DELTASONE) 50 MG tablet; Take one tablet for 5 days. ? ? ?Prednisone sent for 5 days.  ?Discussed cool compresses for itching and as needed Benadryl.  ? ? ?Follow Up Instructions: ? ?  ?I discussed the assessment and treatment plan with the patient. The patient was provided an opportunity to ask questions and all were answered. The patient agreed with the plan and demonstrated an understanding of the instructions. ?  ?The patient was advised to call back or seek an in-person evaluation if the symptoms worsen or if the condition fails to improve as anticipated. ? ?I provided 5 minutes of non-face-to-face time during this encounter. ? ? ?Iran Planas, PA-C ? ?

## 2021-12-05 NOTE — Telephone Encounter (Signed)
Put on schedule, vitals documented.  ?

## 2021-12-10 ENCOUNTER — Ambulatory Visit (INDEPENDENT_AMBULATORY_CARE_PROVIDER_SITE_OTHER): Payer: 59

## 2021-12-10 ENCOUNTER — Other Ambulatory Visit (HOSPITAL_COMMUNITY)
Admission: RE | Admit: 2021-12-10 | Discharge: 2021-12-10 | Disposition: A | Discharge status: Home / Self Care | Payer: 59 | Source: Ambulatory Visit

## 2021-12-10 VITALS — BP 112/74 | HR 112 | Wt 129.0 lb

## 2021-12-10 DIAGNOSIS — Z23 Encounter for immunization: Secondary | ICD-10-CM | POA: Diagnosis not present

## 2021-12-10 DIAGNOSIS — B3731 Acute candidiasis of vulva and vagina: Secondary | ICD-10-CM | POA: Insufficient documentation

## 2021-12-10 DIAGNOSIS — O09522 Supervision of elderly multigravida, second trimester: Secondary | ICD-10-CM

## 2021-12-10 DIAGNOSIS — N898 Other specified noninflammatory disorders of vagina: Secondary | ICD-10-CM

## 2021-12-10 DIAGNOSIS — O09529 Supervision of elderly multigravida, unspecified trimester: Secondary | ICD-10-CM | POA: Diagnosis not present

## 2021-12-10 DIAGNOSIS — Z3A28 28 weeks gestation of pregnancy: Secondary | ICD-10-CM

## 2021-12-10 NOTE — Progress Notes (Signed)
s ? ?PRENATAL VISIT NOTE ? ?Subjective:  ?Melissa Mcclain is a 36 y.o. G2P1001 at [redacted]w[redacted]d being seen today for ongoing prenatal care.  She is currently monitored for the following issues for this high-risk pregnancy and has Seasonal allergies; Asthma, cough variant; PMDD (premenstrual dysphoric disorder); Anxiety and depression; Hyperlipidemia; Cystic fibrosis carrier; Status post vacuum-assisted vaginal delivery; Panic attacks; Vitamin D deficiency; Low ferritin; Corn of toe; GAD (generalized anxiety disorder); and Encounter for supervision of high-risk pregnancy with multigravida of advanced maternal age on their problem list. ? ?Patient reports ongoing RUQ/epigastric pain for several weeks. Worsened with sitting upright, relieved with reclining/lying. No precipitating factors. Has heartburn, but does not think the two are related. No vomiting. Also reporting vaginal itching/dryness x1 week. Contractions: Not present. Vag. Bleeding: None.  Movement: Present. Denies leaking of fluid.  ? ?The following portions of the patient's history were reviewed and updated as appropriate: allergies, current medications, past family history, past medical history, past social history, past surgical history and problem list.  ? ?Objective:  ? ?Vitals:  ? 12/10/21 0830  ?BP: 112/74  ?Pulse: (!) 112  ?Weight: 129 lb (58.5 kg)  ? ? ?Fetal Status: Fetal Heart Rate (bpm): 140 Fundal Height: 28 cm Movement: Present    ? ?General:  Alert, oriented and cooperative. Patient is in no acute distress.  ?Skin: Skin is warm and dry. No rash noted.   ?Cardiovascular: Normal heart rate noted  ?Respiratory: Normal respiratory effort, no problems with respiration noted  ?Abdomen: Soft, gravid, appropriate for gestational age.  Pain/Pressure: Absent     ?Pelvic: Cervical exam deferred        ?Extremities: Normal range of motion.  Edema: None  ?Mental Status: Normal mood and affect. Normal behavior. Normal judgment and thought content.  ? ?Assessment  and Plan:  ?Pregnancy: G2P1001 at [redacted]w[redacted]d ?1. Encounter for supervision of high-risk pregnancy with multigravida of advanced maternal age ?- Routine OB. Doing well. ?- Patient not fasting and declines 2 hr GTT and checking blood sugars at home. Has a significant amount of anxiety related to multiple needle sticks and is even concerned about blood draw today, but amendable. Discussed risks of GDM in pregnancy. I discussed doing 1 hr instead, but with risk that if she fails that, could need to do 3 hr GTT. She is considering doing 1 hr GTT at another visit, but wants to think about it more. ?- Will add on CMP given RUQ/epigastric pain, but likely related to MSK given related to positioning. Patient may also try heat/ice or Tylenol prn ? ?- HIV antibody (with reflex) ?- CBC ?- RPR ?- Tdap vaccine greater than or equal to 7yo IM ?- Comp Met (CMET) ? ?2. [redacted] weeks gestation of pregnancy ? ?- HIV antibody (with reflex) ?- CBC ?- RPR ?- Tdap vaccine greater than or equal to 7yo IM ? ?3. Vaginal itching ?- Self swabbed ?- Cervicovaginal ancillary only( [place]) ? ? ?Preterm labor symptoms and general obstetric precautions including but not limited to vaginal bleeding, contractions, leaking of fluid and fetal movement were reviewed in detail with the patient. ?Please refer to After Visit Summary for other counseling recommendations.  ? ?Return in about 2 weeks (around 12/24/2021). ? ?Future Appointments  ?Date Time Provider [place]  ?12/24/2021 11:10 AM Renee Harder, CNM [facility]  ?01/08/2022 10:45 AM [facility] NURSE [facility]  ?01/08/2022 11:00 AM [facility] US1 [facility]  ? ? ?Renee Harder, CNM ? ?

## 2021-12-11 ENCOUNTER — Other Ambulatory Visit: Payer: Self-pay

## 2021-12-11 DIAGNOSIS — B3731 Acute candidiasis of vulva and vagina: Secondary | ICD-10-CM

## 2021-12-11 DIAGNOSIS — O99019 Anemia complicating pregnancy, unspecified trimester: Secondary | ICD-10-CM

## 2021-12-11 LAB — HIV ANTIBODY (ROUTINE TESTING W REFLEX): HIV 1&2 Ab, 4th Generation: NONREACTIVE

## 2021-12-11 LAB — CBC
HCT: 30.2 % — ABNORMAL LOW (ref 35.0–45.0)
Hemoglobin: 10.1 g/dL — ABNORMAL LOW (ref 11.7–15.5)
MCH: 30.5 pg (ref 27.0–33.0)
MCHC: 33.4 g/dL (ref 32.0–36.0)
MCV: 91.2 fL (ref 80.0–100.0)
MPV: 10.8 fL (ref 7.5–12.5)
Platelets: 303 10*3/uL (ref 140–400)
RBC: 3.31 10*6/uL — ABNORMAL LOW (ref 3.80–5.10)
RDW: 12.9 % (ref 11.0–15.0)
WBC: 13.5 10*3/uL — ABNORMAL HIGH (ref 3.8–10.8)

## 2021-12-11 LAB — COMPREHENSIVE METABOLIC PANEL
AG Ratio: 1.3 (calc) (ref 1.0–2.5)
ALT: 10 U/L (ref 6–29)
AST: 14 U/L (ref 10–30)
Albumin: 3.6 g/dL (ref 3.6–5.1)
Alkaline phosphatase (APISO): 93 U/L (ref 31–125)
BUN: 8 mg/dL (ref 7–25)
CO2: 23 mmol/L (ref 20–32)
Calcium: 9.4 mg/dL (ref 8.6–10.2)
Chloride: 104 mmol/L (ref 98–110)
Creat: 0.5 mg/dL (ref 0.50–0.97)
Globulin: 2.7 g/dL (calc) (ref 1.9–3.7)
Glucose, Bld: 100 mg/dL (ref 65–139)
Potassium: 3.5 mmol/L (ref 3.5–5.3)
Sodium: 137 mmol/L (ref 135–146)
Total Bilirubin: 0.3 mg/dL (ref 0.2–1.2)
Total Protein: 6.3 g/dL (ref 6.1–8.1)

## 2021-12-11 LAB — CERVICOVAGINAL ANCILLARY ONLY
Bacterial Vaginitis (gardnerella): NEGATIVE
Candida Glabrata: NEGATIVE
Candida Vaginitis: POSITIVE — AB
Comment: NEGATIVE
Comment: NEGATIVE
Comment: NEGATIVE

## 2021-12-11 LAB — RPR: RPR Ser Ql: NONREACTIVE

## 2021-12-11 MED ORDER — TERCONAZOLE 0.4 % VA CREA: 1.0000 | TOPICAL_CREAM | Freq: Every day | VAGINAL | 0 refills | Status: DC

## 2021-12-11 MED ORDER — FERROUS SULFATE 325 (65 FE) MG PO TABS: 325.0000 mg | ORAL_TABLET | ORAL | 1 refills | Status: DC

## 2021-12-20 ENCOUNTER — Encounter: Payer: Self-pay | Admitting: Obstetrics & Gynecology

## 2021-12-20 ENCOUNTER — Other Ambulatory Visit: Payer: Self-pay

## 2021-12-20 DIAGNOSIS — B3731 Acute candidiasis of vulva and vagina: Secondary | ICD-10-CM

## 2021-12-20 MED ORDER — TERCONAZOLE 0.4 % VA CREA: 1.0000 | TOPICAL_CREAM | Freq: Every day | VAGINAL | 0 refills | Status: DC

## 2021-12-20 NOTE — Progress Notes (Signed)
Pt requested refill of Terazol via MyChart due to spilling some of the medication. Rx sent.  ?

## 2021-12-24 ENCOUNTER — Ambulatory Visit (INDEPENDENT_AMBULATORY_CARE_PROVIDER_SITE_OTHER): Payer: 59

## 2021-12-24 VITALS — BP 109/72 | HR 105 | Wt 132.0 lb

## 2021-12-24 DIAGNOSIS — O99019 Anemia complicating pregnancy, unspecified trimester: Secondary | ICD-10-CM

## 2021-12-24 DIAGNOSIS — Z3A30 30 weeks gestation of pregnancy: Secondary | ICD-10-CM

## 2021-12-24 DIAGNOSIS — O09529 Supervision of elderly multigravida, unspecified trimester: Secondary | ICD-10-CM

## 2021-12-24 DIAGNOSIS — O99013 Anemia complicating pregnancy, third trimester: Secondary | ICD-10-CM

## 2021-12-24 DIAGNOSIS — O09523 Supervision of elderly multigravida, third trimester: Secondary | ICD-10-CM

## 2021-12-24 NOTE — Progress Notes (Signed)
? ?  PRENATAL VISIT NOTE ? ?Subjective:  ?Melissa Mcclain is a 36 y.o. G2P1001 at 39w1dbeing seen today for ongoing prenatal care.  She is currently monitored for the following issues for this high-risk pregnancy and has Seasonal allergies; Asthma, cough variant; PMDD (premenstrual dysphoric disorder); Anxiety and depression; Hyperlipidemia; Cystic fibrosis carrier; Status post vacuum-assisted vaginal delivery; Panic attacks; Vitamin D deficiency; Low ferritin; Corn of toe; GAD (generalized anxiety disorder); and Encounter for supervision of high-risk pregnancy with multigravida of advanced maternal age on their problem list. ? ?Patient reports no complaints.  Contractions: Not present. Vag. Bleeding: None.  Movement: Present. Denies leaking of fluid.  ? ?The following portions of the patient's history were reviewed and updated as appropriate: allergies, current medications, past family history, past medical history, past social history, past surgical history and problem list.  ? ?Objective:  ? ?Vitals:  ? 12/24/21 1055  ?BP: 109/72  ?Pulse: (!) 105  ?Weight: 132 lb (59.9 kg)  ? ? ?Fetal Status: Fetal Heart Rate (bpm): 143 Fundal Height: 29 cm Movement: Present    ? ?General:  Alert, oriented and cooperative. Patient is in no acute distress.  ?Skin: Skin is warm and dry. No rash noted.   ?Cardiovascular: Normal heart rate noted  ?Respiratory: Normal respiratory effort, no problems with respiration noted  ?Abdomen: Soft, gravid, appropriate for gestational age.  Pain/Pressure: Absent     ?Pelvic: Cervical exam deferred        ?Extremities: Normal range of motion.  Edema: None  ?Mental Status: Normal mood and affect. Normal behavior. Normal judgment and thought content.  ? ?Assessment and Plan:  ?Pregnancy: G2P1001 at 370w1d1. Encounter for supervision of high-risk pregnancy with multigravida of advanced maternal age ?- Routine OB. Doing well, no concerns ?- Patient still declining GTT/fingersticks at home. Patient  aware of risks of GDM in pregnancy. I did discuss doing an A1c today-she declines but considering doing at next visit ?- She is interested in laboring in water birth tub. I discussed with patient that she would have to do GTT for that to even be an option. I also reviewed that she would have to take water birth class as well. She is going to go home and think about what she wants to do ? ?2. [redacted] weeks gestation of pregnancy ?- Endorses active fetal movement ?- FH appropriate ? ?3. Anemia during pregnancy ?- Continue iron supplement ? ?Preterm labor symptoms and general obstetric precautions including but not limited to vaginal bleeding, contractions, leaking of fluid and fetal movement were reviewed in detail with the patient. ?Please refer to After Visit Summary for other counseling recommendations.  ? ?Return in about 2 weeks (around 01/07/2022). ? ?Future Appointments  ?Date Time Provider [place]?01/07/2022 10:10 AM SiRenee HarderCNM [facility]  ?01/08/2022 10:45 AM [facility] NURSE [facility]  ?01/08/2022 11:00 AM [facility] US1 [facility]  ? ? ?DaRenee HarderCNM ? ?

## 2022-01-07 ENCOUNTER — Ambulatory Visit (INDEPENDENT_AMBULATORY_CARE_PROVIDER_SITE_OTHER): Payer: 59

## 2022-01-07 VITALS — BP 112/72 | HR 106 | Wt 133.0 lb

## 2022-01-07 DIAGNOSIS — R350 Frequency of micturition: Secondary | ICD-10-CM

## 2022-01-07 DIAGNOSIS — O09529 Supervision of elderly multigravida, unspecified trimester: Secondary | ICD-10-CM

## 2022-01-07 DIAGNOSIS — Z3A32 32 weeks gestation of pregnancy: Secondary | ICD-10-CM

## 2022-01-07 NOTE — Patient Instructions (Signed)
?  Considering Waterbirth? Guide for patients at [facility] ([facility]) Why consider waterbirth? Gentle birth for babies  Less pain medicine used in labor  May allow for passive descent/less pushing  May reduce perineal tears  More mobility and instinctive maternal position changes  Increased maternal relaxation   Is waterbirth safe? What are the risks of infection, drowning or other complications? Infection:  Very low risk (3.7 % for tub vs 4.8% for bed)  7 in 8000 waterbirths with documented infection  Poorly cleaned equipment most common cause  Slightly lower group B strep transmission rate  Drowning  Maternal:  Very low risk  Related to seizures or fainting  Newborn:  Very low risk. No evidence of increased risk of respiratory problems in multiple large studies  Physiological protection from breathing under water  Avoid underwater birth if there are any fetal complications  Once baby's head is out of the water, keep it out.  Birth complication  Some reports of cord trauma, but risk decreased by bringing baby to surface gradually  No evidence of increased risk of shoulder dystocia. Mothers can usually change positions faster in water than in a bed, possibly aiding the maneuvers to free the shoulder.   There are 2 things you MUST do to have a waterbirth with [facility]: Attend a waterbirth class at [facility] [place]   3rd Wednesday of every month from 7-9 pm (virtual during COVID) Free Register online at [email or web address] or [email or web address] or by calling [phone] Bring us the certificate from the class to your prenatal appointment or send via MyChart Meet with a midwife at 36 weeks* to see if you can still plan a waterbirth and to sign the consent.   *We also recommend that you schedule as many of your prenatal visits with a midwife as possible.    Helpful information: You may want to bring a bathing suit top to the hospital  to wear during labor but this is optional.  All other supplies are provided by the hospital. Please arrive at the hospital with signs of active labor, and do not wait at home until late in labor. It takes 45 min- 1 hour for fetal monitoring, and check in to your room to take place, plus transport and filling of the waterbirth tub.    Things that would prevent you from having a waterbirth: Premature, <37wks  Previous cesarean birth  Presence of thick meconium-stained fluid  Multiple gestation (Twins, triplets, etc.)  Uncontrolled diabetes or gestational diabetes requiring medication  Hypertension diagnosed in pregnancy or preexisting hypertension (gestational hypertension, preeclampsia, or chronic hypertension) Fetal growth restriction (your baby measures less than 10th percentile on ultrasound) Heavy vaginal bleeding  Non-reassuring fetal heart rate  Active infection (MRSA, etc.). Group B Strep is NOT a contraindication for waterbirth.  If your labor has to be induced and induction method requires continuous monitoring of the baby's heart rate  Other risks/issues identified by your obstetrical provider   Please remember that birth is unpredictable. Under certain unforeseeable circumstances your provider may advise against giving birth in the tub. These decisions will be made on a case-by-case basis and with the safety of you and your baby as our highest priority.    Updated 12/04/21  

## 2022-01-07 NOTE — Progress Notes (Signed)
? ?  PRENATAL VISIT NOTE ? ?Subjective:  ?Melissa Mcclain is a 36 y.o. G2P1001 at 53w1dbeing seen today for ongoing prenatal care.  She is currently monitored for the following issues for this low-risk pregnancy and has Seasonal allergies; Asthma, cough variant; PMDD (premenstrual dysphoric disorder); Anxiety and depression; Hyperlipidemia; Cystic fibrosis carrier; Status post vacuum-assisted vaginal delivery; Panic attacks; Vitamin D deficiency; Low ferritin; Corn of toe; GAD (generalized anxiety disorder); and Encounter for supervision of high-risk pregnancy with multigravida of advanced maternal age on their problem list. ? ?Patient reports no complaints.  Contractions: Not present. Vag. Bleeding: None.  Movement: Present. Denies leaking of fluid.  ? ?The following portions of the patient's history were reviewed and updated as appropriate: allergies, current medications, past family history, past medical history, past social history, past surgical history and problem list.  ? ?Objective:  ? ?Vitals:  ? 01/07/22 1023  ?BP: 112/72  ?Pulse: (!) 106  ?Weight: 133 lb (60.3 kg)  ? ? ?Fetal Status: Fetal Heart Rate (bpm): 134 Fundal Height: 31 cm Movement: Present    ? ?General:  Alert, oriented and cooperative. Patient is in no acute distress.  ?Skin: Skin is warm and dry. No rash noted.   ?Cardiovascular: Normal heart rate noted  ?Respiratory: Normal respiratory effort, no problems with respiration noted  ?Abdomen: Soft, gravid, appropriate for gestational age.  Pain/Pressure: Absent     ?Pelvic: Cervical exam deferred        ?Extremities: Normal range of motion.  Edema: None  ?Mental Status: Normal mood and affect. Normal behavior. Normal judgment and thought content.  ? ?Assessment and Plan:  ?Pregnancy: G2P1001 at 363w1d1. Encounter for supervision of high-risk pregnancy with multigravida of advanced maternal age ?- Routine OB. Doing well. ?- Patient opted for 1hr GTT today as she is still considering  hydrotherapy/waterbirth during labor ?- She is aware that if she fails 1hr she will need 3hr  ?- Interested in waWeaverquestions answered. Pt to enroll in class, see midwives for most visits. Discussed waterbirth as option for low risk pregnancy. Pregnancy is hard to predict and things may arise that prevent immersion in water but labor can be supported with freedom of movement, birthing ball, shower and birth can be supported in position of pt choosing, etc, even if waterbirth becomes unavailable. Pt states understanding. ? ?- Glucose Tolerance, 1 HR (50g) ?- Urine Culture ? ?2. [redacted] weeks gestation of pregnancy ?- Endorses active fetal movement ?- FH appropriate ? ?3. Urinary frequency ?- Feels like she is constantly having to use bathroom more than usual. Feels like she often has to force urine out. No burning/pain/hematuria, but urine has a strong odor. Will collect culture ? ? ?Preterm labor symptoms and general obstetric precautions including but not limited to vaginal bleeding, contractions, leaking of fluid and fetal movement were reviewed in detail with the patient. ?Please refer to After Visit Summary for other counseling recommendations.  ? ?Return in about 2 weeks (around 01/21/2022). ? ?Future Appointments  ?Date Time Provider [place]?01/08/2022 10:45 AM [facility] NURSE [facility]  ?01/08/2022 11:00 AM [facility] US1 [facility]  ?01/21/2022 10:50 AM Rasch, JeArtist PaisNP [facility]  ? ? ?DaRenee HarderCNM ? ?

## 2022-01-08 ENCOUNTER — Ambulatory Visit: Payer: 59 | Admitting: *Deleted

## 2022-01-08 ENCOUNTER — Ambulatory Visit: Payer: 59 | Attending: Obstetrics and Gynecology

## 2022-01-08 VITALS — BP 108/61 | HR 102

## 2022-01-08 DIAGNOSIS — O285 Abnormal chromosomal and genetic finding on antenatal screening of mother: Secondary | ICD-10-CM

## 2022-01-08 DIAGNOSIS — O09892 Supervision of other high risk pregnancies, second trimester: Secondary | ICD-10-CM | POA: Diagnosis not present

## 2022-01-08 DIAGNOSIS — O09522 Supervision of elderly multigravida, second trimester: Secondary | ICD-10-CM | POA: Insufficient documentation

## 2022-01-08 DIAGNOSIS — Z3A32 32 weeks gestation of pregnancy: Secondary | ICD-10-CM | POA: Diagnosis not present

## 2022-01-08 DIAGNOSIS — Z3689 Encounter for other specified antenatal screening: Secondary | ICD-10-CM | POA: Diagnosis not present

## 2022-01-08 DIAGNOSIS — O09523 Supervision of elderly multigravida, third trimester: Secondary | ICD-10-CM | POA: Diagnosis not present

## 2022-01-08 DIAGNOSIS — Z141 Cystic fibrosis carrier: Secondary | ICD-10-CM

## 2022-01-08 LAB — URINE CULTURE
MICRO NUMBER:: 13372668
Result:: NO GROWTH
SPECIMEN QUALITY:: ADEQUATE

## 2022-01-08 LAB — GLUCOSE TOLERANCE, 1 HOUR (50G) W/O FASTING: Glucose, 1 Hr, gestational: 114 mg/dL (ref 65–139)

## 2022-01-09 ENCOUNTER — Other Ambulatory Visit (HOSPITAL_COMMUNITY)
Admission: RE | Admit: 2022-01-09 | Discharge: 2022-01-09 | Disposition: A | Discharge status: Home / Self Care | Payer: 59 | Source: Ambulatory Visit

## 2022-01-09 ENCOUNTER — Ambulatory Visit (INDEPENDENT_AMBULATORY_CARE_PROVIDER_SITE_OTHER): Payer: 59

## 2022-01-09 ENCOUNTER — Encounter: Payer: Self-pay | Admitting: Obstetrics & Gynecology

## 2022-01-09 DIAGNOSIS — N898 Other specified noninflammatory disorders of vagina: Secondary | ICD-10-CM | POA: Diagnosis present

## 2022-01-09 NOTE — Progress Notes (Signed)
Pt finished a 7 days course of Terazol but still states that she is having vaginal itching.  She has done a self swab for yeast and BV. ?

## 2022-01-10 LAB — CERVICOVAGINAL ANCILLARY ONLY
Bacterial Vaginitis (gardnerella): NEGATIVE
Candida Glabrata: NEGATIVE
Candida Vaginitis: NEGATIVE
Comment: NEGATIVE
Comment: NEGATIVE
Comment: NEGATIVE
Comment: NEGATIVE
Trichomonas: NEGATIVE

## 2022-01-21 ENCOUNTER — Ambulatory Visit (INDEPENDENT_AMBULATORY_CARE_PROVIDER_SITE_OTHER): Payer: 59 | Admitting: Obstetrics and Gynecology

## 2022-01-21 VITALS — BP 126/81 | HR 128 | Wt 134.0 lb

## 2022-01-21 DIAGNOSIS — R790 Abnormal level of blood mineral: Secondary | ICD-10-CM

## 2022-01-21 DIAGNOSIS — O09529 Supervision of elderly multigravida, unspecified trimester: Secondary | ICD-10-CM

## 2022-01-21 MED ORDER — VITAMIN D2 50 MCG (2000 UT) PO TABS: 1.0000 | ORAL_TABLET | Freq: Every day | ORAL | 8 refills | Status: DC

## 2022-01-21 NOTE — Progress Notes (Signed)
   PRENATAL VISIT NOTE  Subjective:  Melissa Mcclain is a 36 y.o. G2P1001 at 94w1dbeing seen today for ongoing prenatal care.  She is currently monitored for the following issues for this low-risk pregnancy and has Seasonal allergies; Asthma, cough variant; PMDD (premenstrual dysphoric disorder); Anxiety and depression; Hyperlipidemia; Cystic fibrosis carrier; Status post vacuum-assisted vaginal delivery; Panic attacks; Vitamin D deficiency; Low ferritin; Corn of toe; GAD (generalized anxiety disorder); and Encounter for supervision of high-risk pregnancy with multigravida of advanced maternal age on their problem list.  Patient reports shortness of breath that comes and goes. No chest pain or palpitations. No sleeping well at night.   Contractions: Not present. Vag. Bleeding: None.  Movement: Present. Denies leaking of fluid.   The following portions of the patient's history were reviewed and updated as appropriate: allergies, current medications, past family history, past medical history, past social history, past surgical history and problem list.   Objective:   Vitals:   01/21/22 1058  BP: 126/81  Pulse: (!) 128  Weight: 134 lb (60.8 kg)    Fetal Status: Fetal Heart Rate (bpm): 141 Fundal Height: 33 cm Movement: Present     General:  Alert, oriented and cooperative. Patient is in no acute distress.  Skin: Skin is warm and dry. No rash noted.   Cardiovascular: Normal heart rate noted  Respiratory: Normal respiratory effort, no problems with respiration noted  Abdomen: Soft, gravid, appropriate for gestational age.  Pain/Pressure: Absent     Pelvic: Cervical exam deferred        Extremities: Normal range of motion.  Edema: None  Mental Status: Normal mood and affect. Normal behavior. Normal judgment and thought content.   Assessment and Plan:  Pregnancy: G2P1001 at 327w1d1. Encounter for supervision of high-risk pregnancy with multigravida of advanced maternal age  Some degree  of SOB is normal. If it becomes constant she should go to MAU for evaluation. Rx: refill on Vit D. GBS negative visit BP good today. Ok to use OTC melatonin or Unisom. Avoid screens at night. Magnesium OTC is ok.   2. Low ferritin  Continue iron Q every other day.    Preterm labor symptoms and general obstetric precautions including but not limited to vaginal bleeding, contractions, leaking of fluid and fetal movement were reviewed in detail with the patient. Please refer to After Visit Summary for other counseling recommendations.   Return in about 2 weeks (around 02/04/2022).  No future appointments.  JeNoni SaupeNP

## 2022-01-21 NOTE — Progress Notes (Signed)
SOB  Pulse Ox 98%

## 2022-01-30 NOTE — Progress Notes (Signed)
   PRENATAL VISIT NOTE  Subjective:  Melissa Mcclain is a 36 y.o. G2P1001 at 73w0dbeing seen today for ongoing prenatal care.  She is currently monitored for the following issues for this high-risk pregnancy and has Seasonal allergies; Asthma, cough variant; PMDD (premenstrual dysphoric disorder); Anxiety and depression; Hyperlipidemia; Cystic fibrosis carrier; Status post vacuum-assisted vaginal delivery; Panic attacks; Vitamin D deficiency; Low ferritin; Corn of toe; GAD (generalized anxiety disorder); Encounter for supervision of high-risk pregnancy with multigravida of advanced maternal age; and Refusal of blood transfusions as patient is Jehovah's Witness on their problem list.  Patient reports no complaints.  Contractions: Not present. Vag. Bleeding: None.  Movement: Present. Denies leaking of fluid.   The following portions of the patient's history were reviewed and updated as appropriate: allergies, current medications, past family history, past medical history, past social history, past surgical history and problem list.   Objective:   Vitals:   02/03/22 1521  BP: 127/81  Pulse: (!) 128  Weight: 137 lb (62.1 kg)    Fetal Status: Fetal Heart Rate (bpm): 140   Movement: Present     General:  Alert, oriented and cooperative. Patient is in no acute distress.  Skin: Skin is warm and dry. No rash noted.   Cardiovascular: Normal heart rate noted  Respiratory: Normal respiratory effort, no problems with respiration noted  Abdomen: Soft, gravid, appropriate for gestational age.  Pain/Pressure: Absent     Pelvic: Cervical exam deferred        Extremities: Normal range of motion.  Edema: None  Mental Status: Normal mood and affect. Normal behavior. Normal judgment and thought content.   Assessment and Plan:  Pregnancy: G2P1001 at 374w0d. Encounter for supervision of high-risk pregnancy with multigravida of advanced maternal age Cultures today Taking PO iron. Symptomatically feeling  better.  Reviewed PP birth control: vasectomy, but otherwise potentially POP Of note, she is a JeNeurosurgeonwitness. She brought her no blood card today. Copy made and will be scanned in.  Doing waterbirth class 6/15  Preterm labor symptoms and general obstetric precautions including but not limited to vaginal bleeding, contractions, leaking of fluid and fetal movement were reviewed in detail with the patient. Please refer to After Visit Summary for other counseling recommendations.   Return in about 1 week (around 02/10/2022) for OB VISIT, MD or APP.  Future Appointments  Date Time Provider DeRowley6/13/2023 10:30 AM HoTresea MallCNM [facility]/20/2023 10:30 AM SiRenee HarderCNM [facility]6/27/2023 10:30 AM Rasch, JeArtist PaisNP [facility]  PaRadene GunningMD

## 2022-02-03 ENCOUNTER — Ambulatory Visit (INDEPENDENT_AMBULATORY_CARE_PROVIDER_SITE_OTHER): Payer: 59 | Admitting: Obstetrics and Gynecology

## 2022-02-03 ENCOUNTER — Encounter: Payer: Self-pay | Admitting: Obstetrics and Gynecology

## 2022-02-03 ENCOUNTER — Other Ambulatory Visit (HOSPITAL_COMMUNITY)
Admission: RE | Admit: 2022-02-03 | Discharge: 2022-02-03 | Disposition: A | Discharge status: Home / Self Care | Payer: 59 | Source: Ambulatory Visit | Attending: Obstetrics and Gynecology | Admitting: Obstetrics and Gynecology

## 2022-02-03 VITALS — BP 127/81 | HR 128 | Wt 137.0 lb

## 2022-02-03 DIAGNOSIS — Z531 Procedure and treatment not carried out because of patient's decision for reasons of belief and group pressure: Secondary | ICD-10-CM

## 2022-02-03 DIAGNOSIS — O09529 Supervision of elderly multigravida, unspecified trimester: Secondary | ICD-10-CM | POA: Insufficient documentation

## 2022-02-03 DIAGNOSIS — IMO0001 Reserved for inherently not codable concepts without codable children: Secondary | ICD-10-CM | POA: Insufficient documentation

## 2022-02-03 NOTE — Addendum Note (Signed)
Addended by: Lyndal Rainbow on: 02/03/2022 04:20 PM   Modules accepted: Orders

## 2022-02-04 LAB — CERVICOVAGINAL ANCILLARY ONLY
Chlamydia: NEGATIVE
Comment: NEGATIVE
Comment: NORMAL
Neisseria Gonorrhea: NEGATIVE

## 2022-02-06 LAB — CULTURE, BETA STREP (GROUP B ONLY)
MICRO NUMBER:: 13488386
SPECIMEN QUALITY:: ADEQUATE

## 2022-02-06 LAB — OB RESULTS CONSOLE GBS: GBS: NEGATIVE

## 2022-02-09 ENCOUNTER — Encounter: Payer: Self-pay | Admitting: Obstetrics & Gynecology

## 2022-02-11 ENCOUNTER — Encounter: Payer: Self-pay | Admitting: Advanced Practice Midwife

## 2022-02-11 ENCOUNTER — Ambulatory Visit (INDEPENDENT_AMBULATORY_CARE_PROVIDER_SITE_OTHER): Payer: 59 | Admitting: Advanced Practice Midwife

## 2022-02-11 VITALS — BP 118/79 | HR 93 | Wt 136.0 lb

## 2022-02-11 DIAGNOSIS — Z3A37 37 weeks gestation of pregnancy: Secondary | ICD-10-CM

## 2022-02-11 DIAGNOSIS — O368130 Decreased fetal movements, third trimester, not applicable or unspecified: Secondary | ICD-10-CM

## 2022-02-11 DIAGNOSIS — O09529 Supervision of elderly multigravida, unspecified trimester: Secondary | ICD-10-CM | POA: Diagnosis not present

## 2022-02-11 NOTE — Progress Notes (Signed)
   PRENATAL VISIT NOTE  Subjective:  Melissa Mcclain is a 36 y.o. G2P1001 at 55w1dbeing seen today for ongoing prenatal care.  She is currently monitored for the following issues for this low-risk pregnancy and has Seasonal allergies; Asthma, cough variant; PMDD (premenstrual dysphoric disorder); Anxiety and depression; Hyperlipidemia; Cystic fibrosis carrier; Status post vacuum-assisted vaginal delivery; Panic attacks; Vitamin D deficiency; Low ferritin; Corn of toe; GAD (generalized anxiety disorder); Encounter for supervision of high-risk pregnancy with multigravida of advanced maternal age; and Refusal of blood transfusions as patient is Jehovah's Witness on their problem list.  Patient reports no complaints.  Contractions: Not present. Vag. Bleeding: None.  Movement: Present. Denies leaking of fluid.   The following portions of the patient's history were reviewed and updated as appropriate: allergies, current medications, past family history, past medical history, past social history, past surgical history and problem list.   Objective:   Vitals:   02/11/22 1029  BP: 118/79  Pulse: 93  Weight: 136 lb (61.7 kg)    Fetal Status: Fetal Heart Rate (bpm): 134 Fundal Height: 35 cm Movement: Present     General:  Alert, oriented and cooperative. Patient is in no acute distress.  Skin: Skin is warm and dry. No rash noted.   Cardiovascular: Normal heart rate noted  Respiratory: Normal respiratory effort, no problems with respiration noted  Abdomen: Soft, gravid, appropriate for gestational age.  Pain/Pressure: Absent     Pelvic: Cervical exam deferred        Extremities: Normal range of motion.  Edema: None  Mental Status: Normal mood and affect. Normal behavior. Normal judgment and thought content.  NST:  Baseline: 130 Variability: moderate Accels: 15x15 Decels: none Toco: none Reactive  Assessment and Plan:  Pregnancy: G2P1001 at 329w1d. Encounter for supervision of high-risk  pregnancy with multigravida of advanced maternal age - routine care  2. [redacted] weeks gestation of pregnancy - GBS negative   3. Decreased fetal movement - Reactive NST today  - continue fetal kick counts and call or come to MAU with any decreased fetal movement   Term labor symptoms and general obstetric precautions including but not limited to vaginal bleeding, contractions, leaking of fluid and fetal movement were reviewed in detail with the patient. Please refer to After Visit Summary for other counseling recommendations.   Return in about 1 week (around 02/18/2022).  Future Appointments  Date Time Provider [place]6/20/2023 10:30 AM Simpson, DaBrimsonWBeth [facility]6/27/2023 10:30 AM Rasch, JeArtist PaisNP [facility] HeMarcille BuffyNP, CNM  02/11/22  11:30 AM

## 2022-02-18 ENCOUNTER — Ambulatory Visit (INDEPENDENT_AMBULATORY_CARE_PROVIDER_SITE_OTHER): Payer: 59

## 2022-02-18 VITALS — BP 118/77 | HR 93 | Wt 136.0 lb

## 2022-02-18 DIAGNOSIS — O09529 Supervision of elderly multigravida, unspecified trimester: Secondary | ICD-10-CM

## 2022-02-18 DIAGNOSIS — Z3A38 38 weeks gestation of pregnancy: Secondary | ICD-10-CM

## 2022-02-18 NOTE — Progress Notes (Signed)
   PRENATAL VISIT NOTE  Subjective:  Melissa Mcclain is a 36 y.o. G2P1001 at 63w1dbeing seen today for ongoing prenatal care.  She is currently monitored for the following issues for this high-risk pregnancy and has Seasonal allergies; Asthma, cough variant; PMDD (premenstrual dysphoric disorder); Anxiety and depression; Hyperlipidemia; Cystic fibrosis carrier; Status post vacuum-assisted vaginal delivery; Panic attacks; Vitamin D deficiency; Low ferritin; Corn of toe; GAD (generalized anxiety disorder); Encounter for supervision of high-risk pregnancy with multigravida of advanced maternal age; and Refusal of blood transfusions as patient is Jehovah's Witness on their problem list.  Patient reports  cramping .  Contractions: Irritability. Vag. Bleeding: None.  Movement: Present. Denies leaking of fluid.   The following portions of the patient's history were reviewed and updated as appropriate: allergies, current medications, past family history, past medical history, past social history, past surgical history and problem list.   Objective:   Vitals:   02/18/22 1036  BP: 118/77  Pulse: 93  Weight: 136 lb (61.7 kg)    Fetal Status: Fetal Heart Rate (bpm): 138 Fundal Height: 37 cm Movement: Present     General:  Alert, oriented and cooperative. Patient is in no acute distress.  Skin: Skin is warm and dry. No rash noted.   Cardiovascular: Normal heart rate noted  Respiratory: Normal respiratory effort, no problems with respiration noted  Abdomen: Soft, gravid, appropriate for gestational age.  Pain/Pressure: Present     Pelvic: Cervical exam deferred        Extremities: Normal range of motion.  Edema: None  Mental Status: Normal mood and affect. Normal behavior. Normal judgment and thought content.   Assessment and Plan:  Pregnancy: G2P1001 at 329w1d. Encounter for supervision of high-risk pregnancy with multigravida of advanced maternal age - Routine OB. Doing well. Mild cramping.  Declines cervical exam today - Completed water birth course. Certificate to be uploaded into chart - Consent to be signed at hospital by delivering provider - Vertex by [facility] - [place] [facility] provided  2. [redacted] weeks gestation of pregnancy - Endorses active fetal movement - FH appropriate  Term labor symptoms and general obstetric precautions including but not limited to vaginal bleeding, contractions, leaking of fluid and fetal movement were reviewed in detail with the patient. Please refer to After Visit Summary for other counseling recommendations.   Return in about 1 week (around 02/25/2022) for ROB.  Future Appointments  Date Time Provider De[place]6/27/2023 10:30 AM Rasch, JeArtist PaisNP [facility] CW[facility]7/10/2021 11:10 AM LeGala RomneyKeFredderick PhenixMD CWWarm SpringsCNM

## 2022-02-25 ENCOUNTER — Ambulatory Visit (INDEPENDENT_AMBULATORY_CARE_PROVIDER_SITE_OTHER): Payer: 59 | Admitting: Obstetrics and Gynecology

## 2022-02-25 VITALS — BP 122/79 | HR 94 | Wt 137.0 lb

## 2022-02-25 DIAGNOSIS — O09529 Supervision of elderly multigravida, unspecified trimester: Secondary | ICD-10-CM

## 2022-03-03 ENCOUNTER — Inpatient Hospital Stay (HOSPITAL_COMMUNITY): Admit: 2022-03-03 | Payer: Self-pay

## 2022-03-03 ENCOUNTER — Ambulatory Visit (INDEPENDENT_AMBULATORY_CARE_PROVIDER_SITE_OTHER): Payer: 59 | Admitting: Obstetrics & Gynecology

## 2022-03-03 VITALS — BP 129/80 | HR 97 | Wt 136.0 lb

## 2022-03-03 DIAGNOSIS — R790 Abnormal level of blood mineral: Secondary | ICD-10-CM

## 2022-03-03 DIAGNOSIS — O09529 Supervision of elderly multigravida, unspecified trimester: Secondary | ICD-10-CM

## 2022-03-03 NOTE — Progress Notes (Signed)
   PRENATAL VISIT NOTE  Subjective:  Melissa Mcclain is a 36 y.o. G2P1001 at 16w0dbeing seen today for ongoing prenatal care.  She is currently monitored for the following issues for this low-risk pregnancy and has Seasonal allergies; Asthma, cough variant; PMDD (premenstrual dysphoric disorder); Anxiety and depression; Hyperlipidemia; Cystic fibrosis carrier; Status post vacuum-assisted vaginal delivery; Panic attacks; Vitamin D deficiency; Low ferritin; Corn of toe; GAD (generalized anxiety disorder); Encounter for supervision of high-risk pregnancy with multigravida of advanced maternal age; and Refusal of blood transfusions as patient is Jehovah's Witness on their problem list.  Patient reports no complaints.  Contractions: Irritability. Vag. Bleeding: None.  Movement: Present. Denies leaking of fluid.   The following portions of the patient's history were reviewed and updated as appropriate: allergies, current medications, past family history, past medical history, past social history, past surgical history and problem list.   Objective:   Vitals:   03/03/22 1112  BP: 129/80  Pulse: 97  Weight: 136 lb (61.7 kg)    Fetal Status: Fetal Heart Rate (bpm): 134   Movement: Present     General:  Alert, oriented and cooperative. Patient is in no acute distress.  Skin: Skin is warm and dry. No rash noted.   Cardiovascular: Normal heart rate noted  Respiratory: Normal respiratory effort, no problems with respiration noted  Abdomen: Soft, gravid, appropriate for gestational age.  Pain/Pressure: Present     Pelvic: Cervical exam deferred        Extremities: Normal range of motion.  Edema: None  Mental Status: Normal mood and affect. Normal behavior. Normal judgment and thought content.   Assessment and Plan:  Pregnancy: G2P1001 at 460w0dhere are no diagnoses linked to this encounter. Term labor symptoms and general obstetric precautions including but not limited to vaginal bleeding,  contractions, leaking of fluid and fetal movement were reviewed in detail with the patient. Please refer to After Visit Summary for other counseling recommendations.    Continue iron Vtx by US[place]ST today and NST Thursday + AFI Woule like to go to 42 weeks if she could.  Pt understands risks of IUFD and need for antenatal testing. Baseline:  120 Accelerations:  present Decelerations:  absent Variability:  moderate Interpretation:  Reactive NST   KeSilas SacramentoMD

## 2022-03-06 ENCOUNTER — Ambulatory Visit: Payer: 59

## 2022-03-06 ENCOUNTER — Ambulatory Visit (INDEPENDENT_AMBULATORY_CARE_PROVIDER_SITE_OTHER): Payer: 59 | Admitting: *Deleted

## 2022-03-06 VITALS — BP 124/80 | HR 109 | Wt 137.0 lb

## 2022-03-06 DIAGNOSIS — O480 Post-term pregnancy: Secondary | ICD-10-CM

## 2022-03-06 NOTE — Progress Notes (Cosign Needed)
Pt here for NST and AFI for post dates.   Both were normal and routed to Dr Rosana Hoes.

## 2022-03-06 NOTE — Progress Notes (Signed)
I have reviewed this chart and agree with the RN/CMA assessment and management.    Feliz Beam, MD, [place] for [employer] [facility])

## 2022-03-08 ENCOUNTER — Inpatient Hospital Stay (HOSPITAL_COMMUNITY)
Admission: AD | Admit: 2022-03-08 | Discharge: 2022-03-10 | DRG: 807 | Disposition: A | Discharge status: Home / Self Care | Payer: 59 | Attending: Obstetrics & Gynecology | Admitting: Obstetrics & Gynecology

## 2022-03-08 ENCOUNTER — Encounter: Payer: Self-pay | Admitting: Obstetrics & Gynecology

## 2022-03-08 ENCOUNTER — Encounter (HOSPITAL_COMMUNITY): Payer: Self-pay | Admitting: Obstetrics & Gynecology

## 2022-03-08 DIAGNOSIS — O4290 Premature rupture of membranes, unspecified as to length of time between rupture and onset of labor, unspecified weeks of gestation: Principal | ICD-10-CM | POA: Diagnosis present

## 2022-03-08 DIAGNOSIS — Z3A40 40 weeks gestation of pregnancy: Secondary | ICD-10-CM | POA: Diagnosis not present

## 2022-03-08 DIAGNOSIS — IMO0001 Reserved for inherently not codable concepts without codable children: Secondary | ICD-10-CM

## 2022-03-08 DIAGNOSIS — O480 Post-term pregnancy: Secondary | ICD-10-CM | POA: Diagnosis not present

## 2022-03-08 DIAGNOSIS — J45909 Unspecified asthma, uncomplicated: Secondary | ICD-10-CM | POA: Diagnosis not present

## 2022-03-08 DIAGNOSIS — Z141 Cystic fibrosis carrier: Secondary | ICD-10-CM

## 2022-03-08 DIAGNOSIS — O9952 Diseases of the respiratory system complicating childbirth: Secondary | ICD-10-CM | POA: Diagnosis not present

## 2022-03-08 DIAGNOSIS — O09299 Supervision of pregnancy with other poor reproductive or obstetric history, unspecified trimester: Secondary | ICD-10-CM

## 2022-03-08 DIAGNOSIS — Z23 Encounter for immunization: Secondary | ICD-10-CM | POA: Diagnosis not present

## 2022-03-08 DIAGNOSIS — Z531 Procedure and treatment not carried out because of patient's decision for reasons of belief and group pressure: Secondary | ICD-10-CM

## 2022-03-08 LAB — CBC
HCT: 34.8 % — ABNORMAL LOW (ref 36.0–46.0)
Hemoglobin: 11.5 g/dL — ABNORMAL LOW (ref 12.0–15.0)
MCH: 29.6 pg (ref 26.0–34.0)
MCHC: 33 g/dL (ref 30.0–36.0)
MCV: 89.7 fL (ref 80.0–100.0)
Platelets: 216 10*3/uL (ref 150–400)
RBC: 3.88 MIL/uL (ref 3.87–5.11)
RDW: 16.2 % — ABNORMAL HIGH (ref 11.5–15.5)
WBC: 10.3 10*3/uL (ref 4.0–10.5)
nRBC: 0 % (ref 0.0–0.2)

## 2022-03-08 LAB — POCT FERN TEST: POCT Fern Test: POSITIVE

## 2022-03-08 MED ORDER — PHENYLEPHRINE 80 MCG/ML (10ML) SYRINGE FOR IV PUSH (FOR BLOOD PRESSURE SUPPORT): 80.0000 ug | PREFILLED_SYRINGE | INTRAVENOUS | Status: DC | PRN

## 2022-03-08 MED ORDER — EPHEDRINE 5 MG/ML INJ: 10.0000 mg | INTRAVENOUS | Status: DC | PRN

## 2022-03-08 MED ORDER — LACTATED RINGERS IV SOLN: INTRAVENOUS | Status: DC

## 2022-03-08 MED ORDER — ONDANSETRON HCL 4 MG/2ML IJ SOLN: 4.0000 mg | Freq: Four times a day (QID) | INTRAMUSCULAR | Status: DC | PRN

## 2022-03-08 MED ORDER — LACTATED RINGERS IV SOLN: 500.0000 mL | INTRAVENOUS | Status: DC | PRN

## 2022-03-08 MED ORDER — DIPHENHYDRAMINE HCL 50 MG/ML IJ SOLN: 12.5000 mg | INTRAMUSCULAR | Status: DC | PRN

## 2022-03-08 MED ORDER — FENTANYL CITRATE (PF) 100 MCG/2ML IJ SOLN: 100.0000 ug | INTRAMUSCULAR | Status: DC | PRN

## 2022-03-08 MED ORDER — LIDOCAINE HCL (PF) 1 % IJ SOLN: 30.0000 mL | INTRAMUSCULAR | Status: DC | PRN

## 2022-03-08 MED ORDER — ACETAMINOPHEN 325 MG PO TABS: 650.0000 mg | ORAL_TABLET | ORAL | Status: DC | PRN

## 2022-03-08 MED ORDER — OXYCODONE-ACETAMINOPHEN 5-325 MG PO TABS: 2.0000 | ORAL_TABLET | ORAL | Status: DC | PRN

## 2022-03-08 MED ORDER — OXYCODONE-ACETAMINOPHEN 5-325 MG PO TABS: 1.0000 | ORAL_TABLET | ORAL | Status: DC | PRN

## 2022-03-08 MED ORDER — SOD CITRATE-CITRIC ACID 500-334 MG/5ML PO SOLN: 30.0000 mL | ORAL | Status: DC | PRN

## 2022-03-08 MED ORDER — SODIUM CHLORIDE 0.9% FLUSH: 3.0000 mL | INTRAVENOUS | Status: DC | PRN

## 2022-03-08 MED ORDER — SODIUM CHLORIDE 0.9% FLUSH: 3.0000 mL | Freq: Two times a day (BID) | INTRAVENOUS | Status: DC

## 2022-03-08 MED ORDER — FENTANYL-BUPIVACAINE-NACL 0.5-0.125-0.9 MG/250ML-% EP SOLN
12.0000 mL/h | EPIDURAL | Status: DC | PRN
  Administered 2022-03-09: 12 mL/h via EPIDURAL
  Filled 2022-03-08: qty 250

## 2022-03-08 MED ORDER — FLEET ENEMA 7-19 GM/118ML RE ENEM: 1.0000 | ENEMA | RECTAL | Status: DC | PRN

## 2022-03-08 MED ORDER — OXYTOCIN BOLUS FROM INFUSION
333.0000 mL | Freq: Once | INTRAVENOUS | Status: AC
  Administered 2022-03-09: 333 mL via INTRAVENOUS

## 2022-03-08 MED ORDER — OXYTOCIN-SODIUM CHLORIDE 30-0.9 UT/500ML-% IV SOLN
2.5000 [IU]/h | INTRAVENOUS | Status: DC
  Filled 2022-03-08: qty 500

## 2022-03-08 MED ORDER — SODIUM CHLORIDE 0.9 % IV SOLN: 250.0000 mL | INTRAVENOUS | Status: DC | PRN

## 2022-03-08 MED ORDER — PHENYLEPHRINE 80 MCG/ML (10ML) SYRINGE FOR IV PUSH (FOR BLOOD PRESSURE SUPPORT)
80.0000 ug | PREFILLED_SYRINGE | INTRAVENOUS | Status: DC | PRN
  Filled 2022-03-08: qty 10

## 2022-03-08 MED ORDER — LACTATED RINGERS IV SOLN
500.0000 mL | Freq: Once | INTRAVENOUS | Status: AC
  Administered 2022-03-08: 500 mL via INTRAVENOUS

## 2022-03-08 NOTE — MAU Note (Addendum)
.  Melissa Mcclain is a 36 y.o. at 67w5dhere in MAU reporting: possible SROM with clear fluid and some bloody show at 1628 today and CTX every 1-3 mins since 1800. Pt denies VB, PIH DFM, PIH s/s, recent intercourse, and complications in the pregnancy.  GBS neg SVE last week 1 cm HSV neg Onset of complaint: 1628 Pain score: 7/10 Vitals:   03/08/22 2215  BP: 129/85  Pulse: 92  Resp: 19  Temp: 98.1 F (36.7 C)  SpO2: 96%     FHT:151 Lab orders placed from triage:  none

## 2022-03-08 NOTE — MAU Provider Note (Signed)
Chief Complaint  Patient presents with   Labor Eval   Contractions    Event Date/Time   First Provider Initiated Contact with Patient 03/08/22 2232     S: Melissa Mcclain  is a 36 y.o. y.o. year old G49P1001 female at 31w5dweeks gestation who presents to MAU reporting several episodes of leaking of clear  fluid since 1628 and strong contractions every 2 minutes. Scant bloody show.  Fetal movement: Active  O: Patient Vitals for the past 24 hrs:  BP Temp Pulse Resp SpO2 Height Weight  03/08/22 2239 114/78 -- 97 -- -- -- --  03/08/22 2215 129/85 98.1 F (36.7 C) 92 19 96 % '5\' 2"'$  (1.575 m) 62.5 kg   General: NAD Heart: Regular rate Lungs: Normal rate and effort Abd: Soft, NT, Gravid, S=D Pelvic: NEFG, Grossly ruptures clear fluid, scant bloody show  Dilation: 4 Effacement (%): 90 Cervical Position: Posterior Station: Plus 1 Presentation: Vertex Exam by:: VManya SilvasCNM  EFM: 135, Moderate variability, 15 x 15 accelerations, no decelerations Toco: Q 2, mod   A: 437w5deek IUP Grossly ruptures early labor FHR reactive  P: Admit to L&D  Originally planned waterbirth. Is a candidate. Consent signed. Now requesting epidural.   SmTamala JulianViVermontCNTroy/04/2022 11:13 PM  2

## 2022-03-09 ENCOUNTER — Inpatient Hospital Stay (HOSPITAL_COMMUNITY): Payer: 59 | Admitting: Anesthesiology

## 2022-03-09 ENCOUNTER — Encounter (HOSPITAL_COMMUNITY): Payer: Self-pay | Admitting: Obstetrics & Gynecology

## 2022-03-09 ENCOUNTER — Other Ambulatory Visit: Payer: Self-pay

## 2022-03-09 DIAGNOSIS — Z3A40 40 weeks gestation of pregnancy: Secondary | ICD-10-CM | POA: Diagnosis not present

## 2022-03-09 DIAGNOSIS — O480 Post-term pregnancy: Secondary | ICD-10-CM | POA: Diagnosis not present

## 2022-03-09 LAB — CBC
HCT: 32.5 % — ABNORMAL LOW (ref 36.0–46.0)
Hemoglobin: 10.8 g/dL — ABNORMAL LOW (ref 12.0–15.0)
MCH: 29.6 pg (ref 26.0–34.0)
MCHC: 33.2 g/dL (ref 30.0–36.0)
MCV: 89 fL (ref 80.0–100.0)
Platelets: 200 10*3/uL (ref 150–400)
RBC: 3.65 MIL/uL — ABNORMAL LOW (ref 3.87–5.11)
RDW: 16 % — ABNORMAL HIGH (ref 11.5–15.5)
WBC: 14 10*3/uL — ABNORMAL HIGH (ref 4.0–10.5)
nRBC: 0 % (ref 0.0–0.2)

## 2022-03-09 LAB — NO BLOOD PRODUCTS

## 2022-03-09 LAB — ANTIBODY SCREEN: Antibody Screen: NEGATIVE

## 2022-03-09 LAB — ABO/RH: ABO/RH(D): O POS

## 2022-03-09 LAB — RPR: RPR Ser Ql: NONREACTIVE

## 2022-03-09 MED ORDER — BENZOCAINE-MENTHOL 20-0.5 % EX AERO
1.0000 | INHALATION_SPRAY | CUTANEOUS | Status: DC | PRN
  Filled 2022-03-09: qty 56

## 2022-03-09 MED ORDER — WITCH HAZEL-GLYCERIN EX PADS: 1.0000 | MEDICATED_PAD | CUTANEOUS | Status: DC | PRN

## 2022-03-09 MED ORDER — ONDANSETRON HCL 4 MG/2ML IJ SOLN: 4.0000 mg | INTRAMUSCULAR | Status: DC | PRN

## 2022-03-09 MED ORDER — ACETAMINOPHEN 325 MG PO TABS
650.0000 mg | ORAL_TABLET | ORAL | Status: DC | PRN
  Administered 2022-03-09 – 2022-03-10 (×4): 650 mg via ORAL
  Filled 2022-03-09 (×4): qty 2

## 2022-03-09 MED ORDER — COCONUT OIL OIL: 1.0000 | TOPICAL_OIL | Status: DC | PRN

## 2022-03-09 MED ORDER — LIDOCAINE-EPINEPHRINE (PF) 2 %-1:200000 IJ SOLN
INTRAMUSCULAR | Status: DC | PRN
  Administered 2022-03-09: 3 mL via EPIDURAL

## 2022-03-09 MED ORDER — BUPIVACAINE HCL (PF) 0.25 % IJ SOLN
INTRAMUSCULAR | Status: DC | PRN
  Administered 2022-03-09: 1 mL via INTRATHECAL

## 2022-03-09 MED ORDER — TETANUS-DIPHTH-ACELL PERTUSSIS 5-2.5-18.5 LF-MCG/0.5 IM SUSY: 0.5000 mL | PREFILLED_SYRINGE | Freq: Once | INTRAMUSCULAR | Status: DC

## 2022-03-09 MED ORDER — OXYCODONE HCL 5 MG PO TABS
5.0000 mg | ORAL_TABLET | Freq: Once | ORAL | Status: AC
  Administered 2022-03-09: 5 mg via ORAL
  Filled 2022-03-09: qty 1

## 2022-03-09 MED ORDER — SIMETHICONE 80 MG PO CHEW: 80.0000 mg | CHEWABLE_TABLET | ORAL | Status: DC | PRN

## 2022-03-09 MED ORDER — MEASLES, MUMPS & RUBELLA VAC IJ SOLR: 0.5000 mL | Freq: Once | INTRAMUSCULAR | Status: DC

## 2022-03-09 MED ORDER — OXYCODONE HCL 5 MG PO TABS
5.0000 mg | ORAL_TABLET | ORAL | Status: DC | PRN
  Administered 2022-03-09: 5 mg via ORAL
  Filled 2022-03-09: qty 1

## 2022-03-09 MED ORDER — DIBUCAINE (PERIANAL) 1 % EX OINT
1.0000 | TOPICAL_OINTMENT | CUTANEOUS | Status: DC | PRN
Start: 2022-03-09 — End: 2022-03-10

## 2022-03-09 MED ORDER — DIPHENHYDRAMINE HCL 25 MG PO CAPS: 25.0000 mg | ORAL_CAPSULE | Freq: Four times a day (QID) | ORAL | Status: DC | PRN

## 2022-03-09 MED ORDER — IBUPROFEN 600 MG PO TABS
600.0000 mg | ORAL_TABLET | Freq: Four times a day (QID) | ORAL | Status: DC
  Administered 2022-03-09 – 2022-03-10 (×6): 600 mg via ORAL
  Filled 2022-03-09 (×6): qty 1

## 2022-03-09 MED ORDER — ONDANSETRON HCL 4 MG PO TABS: 4.0000 mg | ORAL_TABLET | ORAL | Status: DC | PRN

## 2022-03-09 MED ORDER — SENNOSIDES-DOCUSATE SODIUM 8.6-50 MG PO TABS
2.0000 | ORAL_TABLET | Freq: Every day | ORAL | Status: DC
  Administered 2022-03-10: 2 via ORAL
  Filled 2022-03-09: qty 2

## 2022-03-09 MED ORDER — PRENATAL MULTIVITAMIN CH
1.0000 | ORAL_TABLET | Freq: Every day | ORAL | Status: DC
  Administered 2022-03-09 – 2022-03-10 (×2): 1 via ORAL
  Filled 2022-03-09 (×2): qty 1

## 2022-03-09 MED ORDER — MEDROXYPROGESTERONE ACETATE 150 MG/ML IM SUSP: 150.0000 mg | INTRAMUSCULAR | Status: DC | PRN

## 2022-03-09 NOTE — Discharge Summary (Signed)
Postpartum Discharge Summary  Date of Service updated***     Patient Name: Melissa Mcclain DOB: [birth year] MRN: [id number]  Date of admission: 03/08/2022 Delivery date:03/09/2022  Delivering provider: Renard Matter  Date of discharge: 03/09/2022  Admitting diagnosis: Leakage of amniotic fluid [O42.90] Intrauterine pregnancy: [redacted]w[redacted]d    Secondary diagnosis:  Principal Problem:   Leakage of amniotic fluid Active Problems:   Cystic fibrosis carrier   History of delivery by vacuum extraction, currently pregnant   Refusal of blood transfusions as patient is Jehovah's Witness   Vaginal delivery  Additional problems: ***None   Discharge diagnosis: Term Pregnancy Delivered                                              Post partum procedures:*** Augmentation: N/A Complications: None  Hospital course: Onset of Labor With Vaginal Delivery      36y.o. yo [id number] 443w6das admitted after SROM in Active Labor on 03/08/2022. Patient had an uncomplicated labor course as follows:  Membrane Rupture Time/Date: 4:28 PM ,03/08/2022   Delivery Method:Vaginal, Spontaneous  Episiotomy: None  Lacerations:  2nd degree  Patient had an uncomplicated postpartum course.  She is ambulating, tolerating a regular diet, passing flatus, and urinating well. Patient is discharged home in stable condition on 03/09/22.  Newborn Data: Birth date:03/09/2022  Birth time:2:32 AM  Gender:Female  Living status:Living  Apgars:7 ,8  Weight:3800 g (8lb 6oz)  Magnesium Sulfate received: No BMZ received: No Rhophylac:N/A MMR:N/A T-DaP:Given prenatally Flu: Yes Transfusion:No  Physical exam  Vitals:   03/09/22 1030 03/09/22 1510 03/09/22 1748 03/09/22 1930  BP: 113/73 114/81 125/88 100/82  Pulse: 99 88 87 92  Resp: _0 Temp: 98.8 F (37.1 C) 98.2 F (36.8 C) 99 F (37.2 C) 97.9 F (36.6 C)  TempSrc: Oral Oral  Oral  SpO2: 99% 98% 97% 100%  Weight:      Height:       General: {Exam;  general:21111117} Lochia: {Desc; appropriate/inappropriate:30686::"appropriate"} Uterine Fundus: {Desc; firm/soft:30687} Incision: {Exam; incision:21111123} DVT Evaluation: {Exam; dvYJE:[id number]}abs: Lab Results  Component Value Date   WBC 14.0 (H) 03/09/2022   HGB 10.8 (L) 03/09/2022   HCT 32.5 (L) 03/09/2022   MCV 89.0 03/09/2022   PLT 200 03/09/2022      Latest Ref Rng & Units 12/10/2021    9:34 AM  CMP  Glucose 65 - 139 mg/dL 100   BUN 7 - 25 mg/dL 8   Creatinine 0.50 - 0.97 mg/dL 0.50   Sodium 135 - 146 mmol/L 137   Potassium 3.5 - 5.3 mmol/L 3.5   Chloride 98 - 110 mmol/L 104   CO2 20 - 32 mmol/L 23   Calcium 8.6 - 10.2 mg/dL 9.4   Total Protein 6.1 - 8.1 g/dL 6.3   Total Bilirubin 0.2 - 1.2 mg/dL 0.3   AST 10 - 30 U/L 14   ALT 6 - 29 U/L 10    Edinburgh Score:    03/09/2022    5:16 PM  Edinburgh Postnatal Depression Scale Screening Tool  I have been able to laugh and see the funny side of things. 0  I have looked forward with enjoyment to things. 1  I have blamed myself unnecessarily when things went wrong. 1  I have been anxious or worried for no good reason. 2  I  have felt scared or panicky for no good reason. 2  Things have been getting on top of me. 2  I have been so unhappy that I have had difficulty sleeping. 1  I have felt sad or miserable. 1  I have been so unhappy that I have been crying. 1  The thought of harming myself has occurred to me. 0  Edinburgh Postnatal Depression Scale Total 11     After visit meds:  Allergies as of 03/09/2022       Reactions   Cetirizine Other (See Comments)   Reaction:  Fatigue  fatigue     Med Rec must be completed prior to using this [facility]***        Discharge home in stable condition Infant Feeding: Breast Infant Disposition:home with mother Discharge instruction: per After Visit Summary and Postpartum booklet. Activity: Advance as tolerated. Pelvic rest for 6 weeks.  Diet: routine diet Future  Appointments: No future appointments.  Follow up Visit: Message sent to [facility] by Dr. Cy Blamer 7/9  Please schedule this patient for a In person postpartum visit in 6 weeks with the following provider: Any provider. Additional Postpartum F/U: None   Low risk pregnancy complicated by:  None Delivery mode:  Vaginal, Spontaneous  Anticipated Birth Control:  Unsure   03/09/2022 Myrtis Ser, CNM

## 2022-03-09 NOTE — Lactation Note (Signed)
This note was copied from a baby's chart. Lactation Consultation Note  Patient Name: Melissa Mcclain [id number] Date: 03/09/2022 Reason for consult: L&D Initial assessment;Term Age:37 hours   Initial L&D Consult:  Visited with family < 1 hour after birth Assisted to latch after a few attempts; baby on/off fussiness.  Reaassured parents that lactation services will be available on the M/B unit.  Father at bedside.   Maternal Data    Feeding Mother's Current Feeding Choice: Breast Milk  LATCH Score Latch: Repeated attempts needed to sustain latch, nipple held in mouth throughout feeding, stimulation needed to elicit sucking reflex.  Audible Swallowing: None  Type of Nipple: Everted at rest and after stimulation  Comfort (Breast/Nipple): Soft / non-tender  Hold (Positioning): Assistance needed to correctly position infant at breast and maintain latch.  LATCH Score: 6   Lactation Tools Discussed/Used    Interventions Interventions: Assisted with latch;Skin to skin  Discharge    Consult Status Consult Status: Follow-up from L&D    Pranish Akhavan R Tinzlee Craker 03/09/2022, 3:39 AM

## 2022-03-09 NOTE — Progress Notes (Signed)
Completed on admission 0020

## 2022-03-09 NOTE — Lactation Note (Addendum)
This note was copied from a baby's chart. Lactation Consultation Note  Patient Name: Melissa Mcclain BBCWU'[id number] Date: 03/09/2022 Reason for consult: Initial assessment;Term Age:37 hours  "Melissa Mcclain"  LC in to visit with P2 Mom of term baby.  Baby latched in side lying position when walked into room.  LC notice that baby's nose was deep into breast and baby was latched with pursed lips onto nipple, and baby was swaddled in blanket.  Offered to assist with a more comfortable position.  Mom reports having bad shoulder pain with her first baby from being tense during feedings.  Pillow placed at Mom's back for comfort.  Baby unwrapped from swaddle and she was actively cueing.  Reviewed hand expression and how to assist baby to latch deeper onto breast.  Baby latched easily and was sucking and swallowing consistently.    Mom encouraged to keep baby STS as much as possible and offer the breast often with feeding cues.  Mom aware of IP and OP lactation support and encouraged to call prn.  Maternal Data Has patient been taught Hand Expression?: Yes Does the patient have breastfeeding experience prior to this delivery?: Yes How long did the patient breastfeed?: 3 years  Feeding Mother's Current Feeding Choice: Breast Milk  LATCH Score Latch: Grasps breast easily, tongue down, lips flanged, rhythmical sucking.  Audible Swallowing: Spontaneous and intermittent  Type of Nipple: Everted at rest and after stimulation  Comfort (Breast/Nipple): Soft / non-tender  Hold (Positioning): Assistance needed to correctly position infant at breast and maintain latch.  LATCH Score: 9  Interventions Interventions: Breast feeding basics reviewed;Assisted with latch;Skin to skin;Breast massage;Hand express;Adjust position;Support pillows;Position options;LC Services brochure  Discharge Pump: Personal (Medela PIS at home, insurance pump ordered)  Consult Status Consult Status: Follow-up Date:  03/10/22 Follow-up type: In-patient    Broadus John 03/09/2022, 8:24 AM

## 2022-03-09 NOTE — MAU Note (Signed)
Transferred to LD via stretcher to room 210. Husband with patient.

## 2022-03-09 NOTE — H&P (Signed)
OBSTETRIC ADMISSION HISTORY AND PHYSICAL  Melissa Mcclain is a 36 y.o. female G2P1001 with IUP at 67w6dby LMP presenting for SOL and SROM at 1630. She reports +FMs, No LOF, no VB, no blurry vision, headaches or peripheral edema, and RUQ pain.  She plans on breast feeding. She is unsure regarding birth control. 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  Past Medical History: Past Medical History:  Diagnosis Date   Asthma    Heart murmur    PMDD (premenstrual dysphoric disorder)    Vitamin D deficiency     Past Surgical History: Past Surgical History:  Procedure Laterality Date   HERNIA REPAIR  2006,2007   TONSILECTOMY/ADENOIDECTOMY WITH MYRINGOTOMY  09/01/1994   WISDOM TOOTH EXTRACTION Bilateral     Obstetrical History: OB History     Gravida  2   Para  1   Term  1   Preterm      AB      Living  1      SAB      IAB      Ectopic      Multiple  0   Live Births  1           Social History Social History   Socioeconomic History   Marital status: Married    Spouse name: Not on file   Number of children: Not on file   Years of education: Not on file   Highest education level: Not on file  Occupational History   Not on file  Tobacco Use   Smoking status: Never   Smokeless tobacco: Never  Vaping Use   Vaping Use: Never used  Substance and Sexual Activity   Alcohol use: Not Currently    Comment: not while preg   Drug use: No   Sexual activity: Yes    Birth control/protection: None  Other Topics Concern   Not on file  Social History Narrative   Not on file   Social Determinants of Health   Financial Resource Strain: Not on file  Food Insecurity: Not on file  Transportation  Needs: Not on file  Physical Activity: Not on file  Stress: Not on file  Social Connections: Not on file    Family History: Family History  Problem Relation Age of Onset   Hyperlipidemia Mother    Alcohol abuse Father    Depression Brother    Alcohol abuse Maternal Aunt    Alcohol abuse Maternal Uncle    Alcohol abuse Paternal Aunt    Hyperlipidemia Maternal Grandmother    Alcohol abuse Maternal Grandfather    Cancer Maternal Grandfather    Cancer Paternal Grandmother    Alcohol abuse Paternal Aunt     Allergies: Allergies  Allergen Reactions   Cetirizine Other (See Comments)    Reaction:  Fatigue  fatigue    Pt denies allergies to latex, iodine, or shellfish.  Medications Prior to Admission  Medication Sig Dispense Refill Last Dose   Ergocalciferol (VITAMIN D2) 50 MCG (2000 UT) TABS Take 1 tablet by mouth daily. 30 tablet 8 Past Week   ferrous sulfate (FERROUSUL) 325 (65 FE) MG tablet Take 1 tablet (325 mg total) by mouth every other day. [address]  tablet 1 Past Week   Prenatal Vit-Fe Fumarate-FA (MULTIVITAMIN-PRENATAL) 27-0.8 MG TABS tablet Take 1 tablet by mouth daily at 12 noon.   Past Week     Review of Systems   All systems reviewed and negative except as stated in HPI  Blood pressure 114/78, pulse 97, temperature 98.1 F (36.7 C), resp. rate 17, height '5\' 2"'$  (1.575 m), weight 62.5 kg, last menstrual period 05/27/2021, SpO2 96 %. General appearance: alert, uncomfortable with contractions Lungs: clear to auscultation bilaterally Heart: regular rate and rhythm Abdomen: soft, non-tender; bowel sounds normal Extremities: Homans sign is negative, no sign of DVT Presentation: cephalic Fetal monitoring Baseline: 120 bpm, Variability: Good {> 6 bpm), Accelerations: Reactive, and Decelerations: Absent Uterine activity every 1-2 min Dilation: 10 Effacement (%): 100 Station: -1 Exam by:: Renard Matter MD  Contractions: every 1-2 min   Prenatal labs: ABO, Rh: O/RH(D)  POSITIVE/-- (12/12 1153) Antibody: NO ANTIBODIES DETECTED (12/12 1153) Rubella: 7.16 (12/12 1153) RPR: NON-REACTIVE (04/11 0927)  HBsAg: NON-REACTIVE (12/12 1153)  HIV: NON-REACTIVE (04/11 0927)  GBS:   Negative 1 hr Glucola: normal (114) Genetic screening:  declined NIPS and AFP. Known CF carrier Anatomy US: normal  Prenatal Transfer Tool  Maternal Diabetes: No Genetic Screening: Normal Maternal Ultrasounds/Referrals: Normal Fetal Ultrasounds or other Referrals:  None Maternal Substance Abuse:  No Significant Maternal Medications:  None Significant Maternal Lab Results: Group B Strep negative  Results for orders placed or performed during the hospital encounter of 03/08/22 (from the past 24 hour(s))  CBC   Collection Time: 03/08/22 11:10 PM  Result Value Ref Range   WBC 10.3 4.0 - 10.5 K/uL   RBC 3.88 3.87 - 5.11 MIL/uL   Hemoglobin 11.5 (L) 12.0 - 15.0 g/dL   HCT 34.8 (L) 36.0 - 46.0 %   MCV 89.7 80.0 - 100.0 fL   MCH 29.6 26.0 - 34.0 pg   MCHC 33.0 30.0 - 36.0 g/dL   RDW 16.2 (H) 11.5 - 15.5 %   Platelets 216 150 - 400 K/uL   nRBC 0.0 0.0 - 0.2 %    Patient Active Problem List   Diagnosis Date Noted   Leakage of amniotic fluid 03/08/2022   Refusal of blood transfusions as patient is Jehovah's Witness 02/03/2022   Encounter for supervision of high-risk pregnancy with multigravida of advanced maternal age 31/08/2021   GAD (generalized anxiety disorder) 10/26/2020   Corn of toe 08/17/2020   Low ferritin 07/04/2019   Vitamin D deficiency 06/28/2019   Panic attacks 01/12/2018   Status post vacuum-assisted vaginal delivery 08/22/2015   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 G2P1001 at 62w6dhere for SROM at 15and spontaneous  labor  #Labor:patient is in active labor and upon check on L&D patient is 10cm and -1 station. Will plan for expectant management and anticipate SVD. Patient would like epidural #Pain: Anesthesiologist currently in room for epidural placement #FWB: Cat I #ID:  GBS neg #MOF: breast #MOC: considering POPS or vasectomy #Circ:  N/A  #Declines blood products Signed denial form. Starting HgB 11.5    ARenard Matter MD, MPH OB Fellow, Faculty [facility]

## 2022-03-09 NOTE — Anesthesia Procedure Notes (Signed)
Epidural Patient location during procedure: OB Start time: 03/09/2022 12:10 AM End time: 03/09/2022 12:20 AM  Staffing Anesthesiologist: Freddrick March, MD Performed: anesthesiologist   Preanesthetic Checklist Completed: patient identified, IV checked, risks and benefits discussed, monitors and equipment checked, pre-op evaluation and timeout performed  Epidural Patient position: sitting Prep: DuraPrep and site prepped and draped Patient monitoring: continuous pulse ox, blood pressure, heart rate and cardiac monitor Approach: midline Location: L3-L4 Injection technique: LOR air  Needle:  Needle type: Tuohy  Needle gauge: 17 G Needle length: 9 cm Needle insertion depth: 5 cm Catheter type: closed end flexible Catheter size: 19 Gauge Catheter at skin depth: 10 cm Test dose: negative  Assessment Sensory level: T8 Events: blood not aspirated, injection not painful, no injection resistance, no paresthesia and negative IV test  Additional Notes Patient identified. Risks/Benefits/Options discussed with patient including but not limited to bleeding, infection, nerve damage, paralysis, failed block, incomplete pain control, headache, blood pressure changes, nausea, vomiting, reactions to medication both or allergic, itching and postpartum back pain. Confirmed with bedside nurse the patient's most recent platelet count. Confirmed with patient that they are not currently taking any anticoagulation, have any bleeding history or any family history of bleeding disorders. Patient expressed understanding and wished to proceed. All questions were answered. Sterile technique was used throughout the entire procedure. Please see nursing notes for vital signs. Test dose was given through epidural catheter and negative prior to continuing to dose epidural or start infusion. Warning signs of high block given to the patient including shortness of breath, tingling/numbness in hands, complete motor block,  or any concerning symptoms with instructions to call for help. Patient was given instructions on fall risk and not to get out of bed. All questions and concerns addressed with instructions to call with any issues or inadequate analgesia.    CSE performed without complications. LOR at 5cm at L3/4. 25G pencan inserted with return of CSF. Marcaine administered intrathecally as per chart. Epidural catheter inserted and left at 10cm at skin. Pt tolerated well. VSS.Reason for block:procedure for pain

## 2022-03-09 NOTE — Anesthesia Postprocedure Evaluation (Signed)
Anesthesia Post Note  Patient: Melissa Mcclain  Procedure(s) Performed: AN AD [place]     Patient location during evaluation: Mother Baby Anesthesia Type: Epidural Level of consciousness: awake and alert and oriented Pain management: satisfactory to patient Vital Signs Assessment: post-procedure vital signs reviewed and stable Respiratory status: respiratory function stable Cardiovascular status: stable Postop Assessment: no headache, no backache, epidural receding, patient able to bend at knees, no signs of nausea or vomiting, adequate PO intake and able to ambulate Anesthetic complications: no   No notable events documented.  Last Vitals:  Vitals:   03/09/22 0530 03/09/22 0701  BP: 118/83 118/90  Pulse: (!) 107 84  Resp: 16 20  Temp: 36.8 C 36.7 C  SpO2: 98% 99%    Last Pain:  Vitals:   03/09/22 0720  TempSrc:   PainSc: 2    Pain Goal:                   [name]

## 2022-03-09 NOTE — Anesthesia Preprocedure Evaluation (Signed)
Anesthesia Evaluation  Patient identified by MRN, date of birth, ID band Patient awake    Reviewed: Allergy & Precautions, NPO status , Patient's Chart, lab work & pertinent test results  Airway Mallampati: II  TM Distance: >3 FB Neck ROM: Full    Dental no notable dental hx.    Pulmonary asthma ,    Pulmonary exam normal breath sounds clear to auscultation       Cardiovascular negative cardio ROS Normal cardiovascular exam Rhythm:Regular Rate:Normal     Neuro/Psych PSYCHIATRIC DISORDERS Anxiety Depression negative neurological ROS     GI/Hepatic negative GI ROS, Neg liver ROS,   Endo/Other  negative endocrine ROS  Renal/GU negative Renal ROS  negative genitourinary   Musculoskeletal negative musculoskeletal ROS (+)   Abdominal   Peds  Hematology  (+) REFUSES BLOOD PRODUCTS, JEHOVAH'S WITNESS  Anesthesia Other Findings Presents 10cm dilated  Reproductive/Obstetrics (+) Pregnancy                             Anesthesia Physical Anesthesia Plan  ASA: 2  Anesthesia Plan: Combined Spinal and Epidural   Post-op Pain Management:    Induction:   PONV Risk Score and Plan: Treatment may vary due to age or medical condition  Airway Management Planned: Natural Airway  Additional Equipment:   Intra-op Plan:   Post-operative Plan:   Informed Consent: I have reviewed the patients History and Physical, chart, labs and discussed the procedure including the risks, benefits and alternatives for the proposed anesthesia with the patient or authorized representative who has indicated his/her understanding and acceptance.       Plan Discussed with: Anesthesiologist  Anesthesia Plan Comments: (Patient identified. Risks, benefits, options discussed with patient including but not limited to bleeding, infection, nerve damage, paralysis, failed block, incomplete pain control, headache, blood  pressure changes, nausea, vomiting, reactions to medication, itching, and post partum back pain. Confirmed with bedside nurse the patient's most recent platelet count. Confirmed with the patient that they are not taking any anticoagulation, have any bleeding history or any family history of bleeding disorders. Patient expressed understanding and wishes to proceed. All questions were answered. )        Anesthesia Quick Evaluation

## 2022-03-10 ENCOUNTER — Encounter: Payer: 59 | Admitting: Obstetrics & Gynecology

## 2022-03-10 ENCOUNTER — Other Ambulatory Visit: Payer: 59

## 2022-03-10 MED ORDER — IBUPROFEN 600 MG PO TABS: 600.0000 mg | ORAL_TABLET | Freq: Four times a day (QID) | ORAL | 0 refills | Status: DC | PRN

## 2022-03-10 NOTE — Lactation Note (Signed)
This note was copied from a baby's chart. Lactation Consultation Note  Patient Name: Melissa Mcclain MYTRZ'N Date: 03/10/2022 Reason for consult: Follow-up assessment;Term;Infant weight loss;Breastfeeding assistance (4.21% WL) Age:37 hours  P2, Term, Infant Female, 4.21%WL  LC entered the room and dad was holding baby. Per mom, things are going well with breastfeeding. Mom states that baby has been feeding a lot and she cries when she is not feeding. Mom says that baby finally started to calm down.   Mom states that they will be going home in a few hours.   [place] spoke with mom about pacifier usage, engorgement, warning signs, sore nipples, infant I/O, and outpatient [facility] services.   All questions were answered.   Mom states that she has no other questions or concerns.   Current Feeding Plan:  Breastfeed baby according to feeding cues 8+ times in 24 hours.  Hand express for stimulation or to entice baby to breastfeed.  Put baby STS as soon as possible.  Watch baby's output and call the pediatrician with questions or concerns.  Call outpatient [facility] with questions or concerns.    Interventions Interventions: Breast feeding basics reviewed;Education  Discharge Discharge Education: Engorgement and breast care;Warning signs for feeding baby;Outpatient recommendation  Consult Status Consult Status: Follow-up Date: 03/11/22 Follow-up type: In-patient    Melissa Mcclain 03/10/2022, 1:43 PM

## 2022-03-10 NOTE — Social Work (Signed)
CSW received consult for hx of Anxiety and Depression.  CSW met with Melissa Mcclain to offer support and complete assessment.    CSW entered and introduced role CSW observed Melissa Mcclain feeding infant, Melissa Mcclain sitting on the couch. CSW offered privacy and Melissa Mcclain declined. CSW inquired about how Melissa Mcclain was feeling the past 7 days Melissa Mcclain reported feeling frustrated because she was past her due date and ready for baby to come. CSW inquired about how Melissa Mcclain has been feeling since infant was born. Melissa Mcclain stated "better emotionally, physically not so much". Melissa Mcclain reported having some pain. CSW inquired about when  Melissa Mcclain was diagnosed with anxiety and depression and any treatments/ medications she has been on. Melissa Mcclain reported that she has been seeing a psychiatrist since childhood and could not remember exactly when she was diagnosed. Melissa Mcclain stated I was on Zoloft but stopped taking it once I found I was pregnant". Melissa Mcclain reported that they were "trying to go natural". Melissa Mcclain reported that she would continue the Zoloft if she felt like she needed it but wanted to see how her mood is without the medication. Melissa Mcclain reported that she last saw a therapist in 2020 and no current therapy services have been established. Melissa Mcclain denied any SI or HI.   CSW provided education regarding the baby blues period vs. perinatal mood disorders, discussed treatment and gave resources for mental health follow up if concerns arise.  CSW recommends self-evaluation during the postpartum time period using the New Mom Checklist from Postpartum Progress and encouraged Melissa Mcclain to contact a medical professional if symptoms are noted at any time.     Melissa Mcclain has decided to use [facility] for infants pediatrician. CSW provided review of Sudden Infant Death Syndrome (SIDS) precautions.  Melissa Mcclain reported that they have all necessary items for the infant.   CSW identifies no further need for intervention and no barriers to discharge at this time.   Euriah Matlack, LCSWA Clinical Social Worker  [phone] 

## 2022-03-10 NOTE — Social Work (Signed)
CSW acknowledged consult and completed a clinical assessment.  There are no barriers to d/c.  Clinical assessment notes will be entered at a later time.  Letta Kocher, MSW, [place] Clinical Social Work

## 2022-03-11 ENCOUNTER — Telehealth (HOSPITAL_COMMUNITY): Payer: Self-pay | Admitting: *Deleted

## 2022-03-11 DIAGNOSIS — Z1331 Encounter for screening for depression: Secondary | ICD-10-CM

## 2022-03-11 NOTE — Telephone Encounter (Signed)
Inpatient EPDS score =11. [place] completed. Dr. Nehemiah Settle notified via chart.  Odis Hollingshead, RN 03-11-2022 at 1:25pm

## 2022-03-12 NOTE — BH Specialist Note (Signed)
Integrated Behavioral Health via Telemedicine Visit  03/24/2022 Melissa Mcclain [id number]  Number of [facility] Clinician visits: 1- Initial Visit  Session Start time: 9233   Session End time: 1500  Total time in minutes: 35   Referring Provider: Loma Boston, DO Patient/Family location: Home Melissa Mcclain Provider location: Melissa Mcclain  All persons participating in visit: Patient Melissa Mcclain and Melissa Mcclain   Types of Service: Individual psychotherapy and Video visit  I connected with Melissa Mcclain and/or Melissa Mcclain's  n/a  via  Telephone or Video Enabled Telemedicine Application  (Video is Caregility application) and verified that I am speaking with the correct person using two identifiers. Discussed confidentiality: Yes   I discussed the limitations of telemedicine and the availability of in person appointments.  Discussed there is a possibility of technology failure and discussed alternative modes of communication if that failure occurs.  I discussed that engaging in this telemedicine visit, they consent to the provision of behavioral healthcare and the services will be billed under their insurance.  Patient and/or legal guardian expressed understanding and consented to Telemedicine visit: Yes   Presenting Concerns: Patient and/or family reports the following symptoms/concerns: Anxiety and adjusting to life changes, including new baby and upcoming move; coping with a good support network; prefers not going back on Zoloft for now; open to implementing self-coping strategy. Duration of problem: ongoing with mild increase postpartum; Severity of problem: moderate  Patient and/or Family's Strengths/Protective Factors: Social connections, [place] supports in place (healthy food, safe environments, etc.), Sense of purpose, and Physical Health (exercise, healthy diet, medication compliance, etc.)  Goals  Addressed: Patient will:  Reduce symptoms of: anxiety   Increase knowledge and/or ability of: self-management skills   Demonstrate ability to: Increase healthy adjustment to current life circumstances and Increase adequate support systems for patient/family  Progress towards Goals: Ongoing  Interventions: Interventions utilized:  Mindfulness or Relaxation Training Standardized Assessments completed: GAD-7 and PHQ 9  Patient and/or Family Response: Patient agrees with treatment plan.   Assessment: Patient currently experiencing Generalized anxiety disorder.   Patient may benefit from psychoeducation and brief therapeutic interventions regarding coping with symptoms of anxiety .  Plan: Follow up with behavioral health clinician on : Call Melissa Mcclain at [phone], as needed. Behavioral recommendations:  -Continue prioritizing healthy self-care (regular meals; adequate sleep; accepting practical support from friends/family) until postpartum medical visit -CALM relaxation breathing exercise twice daily (morning; at bedtime); as needed throughout the day. -Consider new mom support group as needed at www.[email or web address].net  Referral(s): [place] (In Clinic)  I discussed the assessment and treatment plan with the patient and/or parent/guardian. They were provided an opportunity to ask questions and all were answered. They agreed with the plan and demonstrated an understanding of the instructions.   They were advised to call back or seek an in-person evaluation if the symptoms worsen or if the condition fails to improve as anticipated.  Melissa Fair, LCSW     03/24/2022    2:33 PM 08/12/2021   11:05 AM 10/24/2020   10:59 AM 08/17/2020   10:57 AM 06/28/2019    9:14 AM  Depression screen PHQ 2/9  Decreased Interest 0 '3 1 2 1  '$ Down, Depressed, Hopeless 0 '3 1 1 1  '$ PHQ - 2 Score 0 '6 2 3 2  '$ Altered sleeping 0 '1 2 1 '$ 0  Tired, decreased energy 0 '3 2 1 2   '$ Change in appetite 0 3  $'1 1 3  'O$ Feeling bad or failure about yourself  1 1 0 0 1  Trouble concentrating 0 0 2 0 1  Moving slowly or fidgety/restless 0 0 1 1 0  Suicidal thoughts 0 0 0 0 0  PHQ-9 Score '1 14 10 7 9  '$ Difficult doing work/chores   Somewhat difficult Somewhat difficult Somewhat difficult      03/24/2022    2:36 PM 08/12/2021   11:06 AM 10/24/2020   10:59 AM 08/17/2020   10:57 AM  GAD 7 : Generalized Anxiety Score  Nervous, Anxious, on Edge '1 2 3 '$ 0  Control/stop worrying '1 1 2 '$ 0  Worry too much - different things 1 0 3 0  Trouble relaxing 1 0 2 0  Restless 0 0 0 1  Easily annoyed or irritable '1 2 2 3  '$ Afraid - awful might happen 1 0 1 0  Total GAD 7 Score '6 5 13 4  '$ Anxiety Difficulty   Somewhat difficult Not difficult at all

## 2022-03-24 ENCOUNTER — Ambulatory Visit (INDEPENDENT_AMBULATORY_CARE_PROVIDER_SITE_OTHER): Payer: 59 | Admitting: Clinical

## 2022-03-24 DIAGNOSIS — F411 Generalized anxiety disorder: Secondary | ICD-10-CM

## 2022-03-24 DIAGNOSIS — R69 Illness, unspecified: Secondary | ICD-10-CM | POA: Diagnosis not present

## 2022-03-24 NOTE — Patient Instructions (Signed)
[facility] [address] [place], [place] [address] [phone] (main office) [phone] (Janyiah Silveri's office)  New Parent Support Groups [email or web address]   

## 2022-04-24 ENCOUNTER — Ambulatory Visit: Payer: 59 | Admitting: Obstetrics and Gynecology

## 2022-05-06 ENCOUNTER — Ambulatory Visit (INDEPENDENT_AMBULATORY_CARE_PROVIDER_SITE_OTHER): Payer: 59

## 2022-05-06 DIAGNOSIS — Z30011 Encounter for initial prescription of contraceptive pills: Secondary | ICD-10-CM

## 2022-05-06 MED ORDER — NORETHIN ACE-ETH ESTRAD-FE 1-20 MG-MCG(24) PO TABS: 1.0000 | ORAL_TABLET | Freq: Every day | ORAL | 11 refills | Status: DC

## 2022-05-06 NOTE — Progress Notes (Signed)
[place] Partum Visit Note  Melissa Mcclain is a 36 y.o. G59P2002 female who presents for a postpartum visit. She is 8 weeks postpartum following a normal spontaneous vaginal delivery.  I have fully reviewed the prenatal and intrapartum course. The delivery was at 40.6 gestational weeks.  Anesthesia: epidural. Postpartum course has been unremarkable. Baby is doing well. Baby is feeding by breast. Bleeding no bleeding. Bowel function is normal. Bladder function is normal. Patient is not sexually active. Contraception method is oral progesterone-only contraceptive. Postpartum depression screening: negative.   The pregnancy intention screening data noted above was reviewed. Potential methods of contraception were discussed. The patient elected to proceed with No data recorded.   Edinburgh Postnatal Depression Scale - 05/06/22 1435       Edinburgh Postnatal Depression Scale:  In the Past 7 Days   I have been able to laugh and see the funny side of things. 0    I have looked forward with enjoyment to things. 1    I have blamed myself unnecessarily when things went wrong. 1    I have been anxious or worried for no good reason. 2    I have felt scared or panicky for no good reason. 2    Things have been getting on top of me. 1    I have been so unhappy that I have had difficulty sleeping. 0    I have felt sad or miserable. 0    I have been so unhappy that I have been crying. 0    The thought of harming myself has occurred to me. 0    Edinburgh Postnatal Depression Scale Total 7             Health Maintenance Due  Topic Date Due   COVID-19 Vaccine (4 - Pfizer series) 08/14/2020   INFLUENZA VACCINE  04/01/2022    The following portions of the patient's history were reviewed and updated as appropriate: allergies, current medications, past family history, past medical history, past social history, past surgical history, and problem list.  Review of Systems Pertinent items are noted in  HPI.  Objective:  BP 108/75   Pulse 88   Ht '5\' 2"'$  (1.575 m)   Wt 113 lb (51.3 kg)   LMP 05/27/2021   BMI 20.67 kg/m    General:  alert and no distress   Breasts:  not indicated  Lungs: Effort normal  Heart:  Regular rate  Abdomen: soft, non-tender; bowel sounds normal; no masses,  no organomegaly   Wound N/a  GU exam:  not indicated       Assessment:   1. Postpartum care and examination - Normal postpartum exam  2. Encounter for BCP (birth control pills) initial prescription - Desires OCP's until husband can get vasectomy  - Norethindrone Acetate-Ethinyl Estrad-FE (LOESTRIN 24 FE) 1-20 MG-MCG(24) tablet; Take 1 tablet by mouth daily.  Dispense: 28 tablet; Refill: 11   Plan:   Essential components of care per ACOG recommendations:  1.  Mood and well being: Patient with negative depression screening today. Reviewed local resources for support.  - Patient tobacco use? No.   - hx of drug use? No.    2. Infant care and feeding:  -Patient currently breastmilk feeding? Yes. Reviewed importance of draining breast regularly to support lactation.  -Social determinants of health (SDOH) reviewed in EPIC. No concerns  3. Sexuality, contraception and birth spacing - Patient does not want a pregnancy in the next year.  Desired family  size is 2 children.  - Reviewed reproductive life planning. Reviewed contraceptive methods based on pt preferences and effectiveness.  Patient desired Oral Contraceptive today.   - Discussed birth spacing of 18 months  4. Sleep and fatigue -Encouraged family/partner/community support of 4 hrs of uninterrupted sleep to help with mood and fatigue  5. Physical Recovery  - Discussed patients delivery and complications. She describes her labor as mixed. - Patient had a Vaginal, no problems at delivery. Patient had a 2nd degree laceration. Perineal healing reviewed. Patient expressed understanding - Patient has urinary incontinence? No. - Patient is safe  to resume physical and sexual activity  6.  Health Maintenance - HM due items addressed Yes - Last pap smear  Diagnosis  Date Value Ref Range Status  10/24/2020   Final   - Negative for intraepithelial lesion or malignancy (NILM)   Pap smear not done at today's visit.  -Breast Cancer screening indicated? No.   7. Chronic Disease/Pregnancy Condition follow up: None  - PCP follow up  Renee Harder, [place] for [place]

## 2022-07-21 ENCOUNTER — Ambulatory Visit (INDEPENDENT_AMBULATORY_CARE_PROVIDER_SITE_OTHER): Payer: 59 | Admitting: Obstetrics and Gynecology

## 2022-07-21 ENCOUNTER — Encounter: Payer: Self-pay | Admitting: Obstetrics and Gynecology

## 2022-07-21 VITALS — BP 114/80 | HR 80 | Resp 16 | Ht 62.0 in | Wt 110.0 lb

## 2022-07-21 DIAGNOSIS — N841 Polyp of cervix uteri: Secondary | ICD-10-CM

## 2022-07-21 NOTE — Progress Notes (Signed)
   GYNECOLOGY OFFICE VISIT NOTE  History:   Melissa Mcclain is a 36 y.o. [id number] here today for cervical polyp noted while she was pregnant. She is having no problems today.     Past Medical History:  Diagnosis Date   Asthma    Heart murmur    PMDD (premenstrual dysphoric disorder)    Vitamin D deficiency     Past Surgical History:  Procedure Laterality Date   HERNIA REPAIR  2006,2007   TONSILECTOMY/ADENOIDECTOMY WITH MYRINGOTOMY  09/01/1994   WISDOM TOOTH EXTRACTION Bilateral     The following portions of the patient's history were reviewed and updated as appropriate: allergies, current medications, past family history, past medical history, past social history, past surgical history and problem list.   Health Maintenance:   Diagnosis  Date Value Ref Range Status  10/24/2020   Final   - Negative for intraepithelial lesion or malignancy (NILM)    Review of Systems:  Pertinent items noted in HPI and remainder of comprehensive ROS otherwise negative.  Physical Exam:  BP 114/80   Pulse 80   Resp 16   Ht '5\' 2"'$  (1.575 m)   Wt 110 lb (49.9 kg)   Breastfeeding Yes   BMI 20.12 kg/m  CONSTITUTIONAL: Well-developed, well-nourished female in no acute distress.  HEENT:  Normocephalic, atraumatic. External right and left ear normal. No scleral icterus.  NECK: Normal range of motion, supple, no masses noted on observation SKIN: No rash noted. Not diaphoretic. No erythema. No pallor. MUSCULOSKELETAL: Normal range of motion. No edema noted. NEUROLOGIC: Alert and oriented to person, place, and time. Normal muscle tone coordination. No cranial nerve deficit noted. PSYCHIATRIC: Normal mood and affect. Normal behavior. Normal judgment and thought content.  PELVIC: Normal appearing external genitalia; normal urethral meatus; normal appearing vaginal mucosa and cervix.  No abnormal discharge noted.  Normal uterine size, no other palpable masses, no uterine or adnexal tenderness. Performed  in the presence of a chaperone  Labs and Imaging No results found for this or any previous visit (from the past 168 hour(s)). No results found.  Assessment and Plan:   1. Cervical polyp Resolved - not present on exam today even after probing with pap brush - not tucked into cervix.  Discussed likely came off at the time of delivery.  Pt reassured.   Routine preventative health maintenance measures emphasized. Please refer to After Visit Summary for other counseling recommendations.   No follow-ups on file.  Radene Gunning, MD, [place] for [facility], [place]

## 2022-10-08 ENCOUNTER — Encounter: Payer: Self-pay | Admitting: Physician Assistant

## 2022-10-08 DIAGNOSIS — D234 Other benign neoplasm of skin of scalp and neck: Secondary | ICD-10-CM

## 2022-11-12 ENCOUNTER — Ambulatory Visit: Payer: 59 | Admitting: Dermatology

## 2022-11-12 ENCOUNTER — Encounter: Payer: Self-pay | Admitting: Dermatology

## 2022-11-12 DIAGNOSIS — D489 Neoplasm of uncertain behavior, unspecified: Secondary | ICD-10-CM

## 2022-11-12 DIAGNOSIS — D239 Other benign neoplasm of skin, unspecified: Secondary | ICD-10-CM

## 2022-11-12 NOTE — Progress Notes (Signed)
   New Patient Visit  Subjective  Melissa Mcclain is a 37 y.o. female who presents for the following: Nevus (Behind right ear. Has seen a dermatologist before but can't remember when. Does not bother her but just wanted it checked out. ).    Objective  Well appearing patient in no apparent distress; mood and affect are within normal limits.  A focused examination was performed including face, hairline, arms. Relevant physical exam findings are noted in the Assessment and Plan.  Right pre auricular Tan-brown and/or pink-flesh-colored symmetric macules and papules.   Left Forearm - Anterior Tan-brown and/or pink-flesh-colored symmetric macules and papules.    Assessment & Plan  Neoplasm of uncertain behavior Right pre auricular  Skin / nail biopsy Type of biopsy: tangential   Informed consent: discussed and consent obtained   Timeout: patient name, date of birth, surgical site, and procedure verified   Instrument used: DermaBlade   Hemostasis achieved with: aluminum chloride   Outcome: patient tolerated procedure well   Post-procedure details: sterile dressing applied and wound care instructions given   Dressing type: petrolatum and bandage    Specimen 1 - Surgical pathology Differential Diagnosis: Diplastic Nevus  Check Margins: No  Intradermal nevus Left Forearm - Anterior  Benign-appearing.  Observation.  Call clinic for new or changing lesions.  Recommend daily use of broad spectrum spf 30+ sunscreen to sun-exposed areas.     Return in about 3 months (around 02/12/2023) for Full body skin check.    Documentation: I have reviewed the above documentation for accuracy and completeness, and I agree with the above  [place], DO

## 2022-11-12 NOTE — Patient Instructions (Signed)
Patient Handout: Wound Care for Skin Biopsy Site  Taking Care of Your Skin Biopsy Site  Proper care of the biopsy site is essential for promoting healing and minimizing scarring. This handout provides instructions on how to care for your biopsy site to ensure optimal recovery.  1. Cleaning the Wound:  Clean the biopsy site daily with gentle soap and water. Gently pat the area dry with a clean, soft towel. Avoid harsh scrubbing or rubbing the area, as this can irritate the skin and delay healing. 2. Applying Aquaphor and Bandage:  After cleaning the wound, apply a thin layer of Aquaphor ointment to the biopsy site. Cover the area with a sterile bandage to protect it from dirt, bacteria, and friction. Change the bandage daily or as needed if it becomes soiled or wet. 3. Continued Care for One Week:  Repeat the cleaning, Aquaphor application, and bandaging process daily for one week following the biopsy procedure. Keeping the wound clean and moist during this initial healing period will help prevent infection and promote optimal healing. 4. Massaging Aquaphor into the Area:  After one week, discontinue the use of bandages but continue to apply Aquaphor to the biopsy site. Gently massage the Aquaphor into the area using circular motions. Massaging the skin helps to promote circulation and prevent the formation of scar tissue. Additional Tips:  Avoid exposing the biopsy site to direct sunlight during the healing process, as this can cause hyperpigmentation or worsen scarring. If you experience any signs of infection, such as increased redness, swelling, warmth, or drainage from the wound, contact your healthcare provider immediately. Follow any additional instructions provided by your healthcare provider for caring for the biopsy site and managing any discomfort. Conclusion:  Taking proper care of your skin biopsy site is crucial for ensuring optimal healing and minimizing scarring. By  following these instructions for cleaning, applying Aquaphor, and massaging the area, you can promote a smooth and successful recovery. If you have any questions or concerns about caring for your biopsy site, don't hesitate to contact your healthcare provider for guidance.  

## 2022-11-13 ENCOUNTER — Other Ambulatory Visit: Payer: Self-pay

## 2022-12-02 ENCOUNTER — Encounter: Payer: Self-pay | Admitting: Dermatology

## 2022-12-09 ENCOUNTER — Encounter: Payer: Self-pay | Admitting: Physician Assistant

## 2022-12-09 ENCOUNTER — Telehealth (INDEPENDENT_AMBULATORY_CARE_PROVIDER_SITE_OTHER): Payer: Self-pay | Admitting: Physician Assistant

## 2022-12-09 DIAGNOSIS — F419 Anxiety disorder, unspecified: Secondary | ICD-10-CM

## 2022-12-09 DIAGNOSIS — F411 Generalized anxiety disorder: Secondary | ICD-10-CM

## 2022-12-09 DIAGNOSIS — F32A Depression, unspecified: Secondary | ICD-10-CM

## 2022-12-09 DIAGNOSIS — F53 Postpartum depression: Secondary | ICD-10-CM | POA: Insufficient documentation

## 2022-12-09 MED ORDER — BUSPIRONE HCL 5 MG PO TABS: 5.0000 mg | ORAL_TABLET | Freq: Three times a day (TID) | ORAL | 1 refills | Status: DC

## 2022-12-09 NOTE — Progress Notes (Signed)
..Virtual Visit via Video Note  I connected with Melissa Mcclain on 12/09/22 at  9:30 AM EDT by a video enabled telemedicine application and verified that I am speaking with the correct person using two identifiers.  Location: Patient: home Provider: clinic  .Marland KitchenParticipating in visit:  Patient: Melissa Provider: Tandy Gaw PA-C   I discussed the limitations of evaluation and management by telemedicine and the availability of in person appointments. The patient expressed understanding and agreed to proceed.  History of Present Illness: Pt is a 37 yo female with history of GAD who had a baby 9 months ago and struggling with anxiety. Her anxiety worsened with first child as well. She was put on zoloft with first child and did help but she did not like how "flat" it made her feel. She would like to try something else. No SI/HC.   Marland Kitchen. 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   History of delivery by vacuum extraction, currently pregnant 08/22/2015   Panic attacks 01/12/2018   Vitamin D deficiency 06/28/2019   Low ferritin 07/04/2019   Corn of toe 08/17/2020   GAD (generalized anxiety disorder) 10/26/2020   Encounter for supervision of high-risk pregnancy with multigravida of advanced maternal age 93/08/2021   Refusal of blood transfusions as patient is Jehovah's Witness 02/03/2022   Leakage of amniotic fluid 03/08/2022   Vaginal delivery 03/09/2022   Post-partum depression 12/09/2022   Resolved Ambulatory Problems    Diagnosis Date Noted   IDA (iron deficiency anemia) 08/16/2014   Gastroesophageal reflux disease without esophagitis 08/16/2014   Pregnant 12/17/2014   Supervision of normal first pregnancy 01/18/2015   Scabies 03/12/2015   Cerumen  impaction 03/12/2015   [redacted] weeks gestation of pregnancy    Choroid plexus cyst of fetus    Encounter for supervision of normal first pregnancy in second trimester    Active labor at term 08/19/2015   Obstetrical laceration, second degree 08/22/2015   Winged scapula, right 12/17/2015   Nummular eczema 01/23/2016   No energy 01/09/2017   Irritable bowel syndrome with diarrhea 01/09/2017   Acute left-sided low back pain without sciatica 06/12/2017   Trouble in sleeping 01/12/2018   Asthmatic bronchitis 02/18/2021   Past Medical History:  Diagnosis Date   Asthma    Heart murmur     Observations/Objective: No acute distress Normal mood and appearance  ..    12/09/2022    9:23 AM 03/24/2022    2:33 PM 08/12/2021   11:05 AM 10/24/2020   10:59 AM 08/17/2020   10:57 AM  Depression screen PHQ 2/9  Decreased Interest 0 0 3 1 2   Down, Depressed, Hopeless 0 0 3 1 1   PHQ - 2 Score 0 0 6 2 3   Altered sleeping 3 0 1 2 1   Tired, decreased energy 2 0 3 2 1   Change in appetite 0 0 3 1 1   Feeling bad or failure about yourself  2 1 1  0 0  Trouble concentrating 3 0 0 2 0  Moving slowly or fidgety/restless 0 0 0 1 1  Suicidal thoughts 0 0 0 0 0  PHQ-9 Score 10 1 14 10 7   Difficult doing work/chores Somewhat difficult   Somewhat difficult Somewhat difficult   ..    12/09/2022    9:25  AM 03/24/2022    2:36 PM 08/12/2021   11:06 AM 10/24/2020   10:59 AM  GAD 7 : Generalized Anxiety Score  Nervous, Anxious, on Edge 3 1 2 3   Control/stop worrying 3 1 1 2   Worry too much - different things 3 1 0 3  Trouble relaxing 3 1 0 2  Restless 2 0 0 0  Easily annoyed or irritable 3 1 2 2   Afraid - awful might happen 3 1 0 1  Total GAD 7 Score 20 6 5 13   Anxiety Difficulty Extremely difficult   Somewhat difficult     Assessment and Plan: Marland KitchenMarland KitchenDiagnoses and all orders for this visit:  Anxiety and depression -     busPIRone (BUSPAR) 5 MG tablet; Take 1 tablet (5 mg total) by mouth 3 (three) times  daily.  GAD (generalized anxiety disorder) -     busPIRone (BUSPAR) 5 MG tablet; Take 1 tablet (5 mg total) by mouth 3 (three) times daily.  Post-partum depression   PHQ and GAD not to goal Pt does not want zoloft because made her too flat in past She is more concerned with anxiety Start buspar up to 3 times a day Reassured safe for breastfeeding Discussed mindfulness and self care Follow up in 6 weeks.     Follow Up Instructions:    I discussed the assessment and treatment plan with the patient. The patient was provided an opportunity to ask questions and all were answered. The patient agreed with the plan and demonstrated an understanding of the instructions.   The patient was advised to call back or seek an in-person evaluation if the symptoms worsen or if the condition fails to improve as anticipated.    Tandy Gaw, PA-C

## 2022-12-09 NOTE — Progress Notes (Signed)
Pt is following on mood,anxiety

## 2022-12-31 ENCOUNTER — Other Ambulatory Visit: Payer: Self-pay | Admitting: Physician Assistant

## 2022-12-31 DIAGNOSIS — F411 Generalized anxiety disorder: Secondary | ICD-10-CM

## 2022-12-31 DIAGNOSIS — F32A Depression, unspecified: Secondary | ICD-10-CM

## 2023-01-02 ENCOUNTER — Encounter: Payer: Self-pay | Admitting: Physician Assistant

## 2023-01-02 DIAGNOSIS — F32A Depression, unspecified: Secondary | ICD-10-CM

## 2023-01-02 DIAGNOSIS — F4010 Social phobia, unspecified: Secondary | ICD-10-CM

## 2023-01-06 NOTE — Telephone Encounter (Signed)
We could try [facility] or [facility] for an Autism eval. Please place referral for behavioral health but mark as outgoing so the referral will drop in my workque and I will do the rest. Hope this helps.

## 2023-04-22 ENCOUNTER — Encounter: Payer: Self-pay | Admitting: Physician Assistant

## 2023-04-23 ENCOUNTER — Ambulatory Visit: Payer: Self-pay | Admitting: Sports Medicine

## 2023-04-23 ENCOUNTER — Telehealth: Payer: Self-pay | Admitting: Physician Assistant

## 2023-04-23 DIAGNOSIS — M654 Radial styloid tenosynovitis [de Quervain]: Secondary | ICD-10-CM

## 2023-04-23 NOTE — Assessment & Plan Note (Signed)
Very pleasant 37 year old female approximately 1 year postpartum, increasing pain radial aspect right wrist with swelling. 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 She is wearing a wrist brace but it does not include a thumb spica extension, she will switch to a thumb spica brace, adding ibuprofen 800 mg 3 times daily, home physical therapy, return to see me in 4 weeks, first extensor compartment injection with ultrasound guidance if not better.

## 2023-04-23 NOTE — Progress Notes (Signed)
    Procedures performed today:    None.  Independent interpretation of notes and tests performed by another provider:   None.  Brief History, Exam, Impression, and Recommendations:    Tendinitis, de Quervain's Very pleasant 37 year old female approximately 1 year postpartum, increasing pain radial aspect right wrist with swelling. 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 She is wearing a wrist brace but it does not include a thumb spica extension, she will switch to a thumb spica brace, adding ibuprofen 800 mg 3 times daily, home physical therapy, return to see me in 4 weeks, first extensor compartment injection with ultrasound guidance if not better.    ____________________________________________ Ihor Austin. Benjamin Stain, M.D., ABFM., CAQSM., AME. Primary Care and Sports Medicine [facility]  Adjunct Professor of Family Medicine  [employer] [facility] [employer] [facility] [employer]  Restaurant manager, fast food

## 2023-04-23 NOTE — Telephone Encounter (Signed)
Referral, clinical notes and clinical notes have been re-faxed to [facility] at [phone]. Office will contact patient to schedule referral appointment.   04/23/2023-Left message on patients voicemail notifying her that original referral was sent to [facility]  to [phone] on 01/07/2023 at 11:22 am and we received confirmation 01/07/2023 at 11:25am.   There is currently 8-10 month wait list for Autism Evaluation. Patient will be placed on wait list and will be contacted to be scheduled for an appointment.

## 2023-04-23 NOTE — Telephone Encounter (Signed)
Patient came into the office stating she had a referral for [facility]. Patient was told by [facility] that no referral had been placed for them. Please advise.

## 2023-05-19 ENCOUNTER — Encounter: Payer: Self-pay | Admitting: Obstetrics and Gynecology

## 2023-05-19 ENCOUNTER — Ambulatory Visit (INDEPENDENT_AMBULATORY_CARE_PROVIDER_SITE_OTHER): Payer: Self-pay | Admitting: Obstetrics and Gynecology

## 2023-05-19 VITALS — BP 116/76 | HR 96 | Ht 62.0 in | Wt 104.0 lb

## 2023-05-19 DIAGNOSIS — O99345 Other mental disorders complicating the puerperium: Secondary | ICD-10-CM | POA: Insufficient documentation

## 2023-05-19 DIAGNOSIS — Z01419 Encounter for gynecological examination (general) (routine) without abnormal findings: Secondary | ICD-10-CM

## 2023-05-19 DIAGNOSIS — F418 Other specified anxiety disorders: Secondary | ICD-10-CM

## 2023-05-19 MED ORDER — BUPROPION HCL ER (XL) 150 MG PO TB24: 150.0000 mg | ORAL_TABLET | Freq: Every day | ORAL | 1 refills | Status: DC

## 2023-05-19 NOTE — Progress Notes (Signed)
GYNECOLOGY ANNUAL PREVENTATIVE CARE ENCOUNTER NOTE  History:     Melissa Mcclain is a 37 y.o. G13P2002 female here for a routine annual gynecologic exam.  Current complaints: None.   Denies abnormal vaginal bleeding, discharge, pelvic pain, problems with intercourse or other gynecologic concerns.   Daughter is 14 months ago. Feels overwhelmed on a regular bases. No suicidal thoughts. + frustration. These symptoms happen every day. Feels overstimulated. Has tried zoloft and buspar in the past. Would like to try Wellbutrin.    Gynecologic History Patient's last menstrual period was 04/30/2023. Contraception: none requests pills  Last Pap: 10/24/2020. Result was normal with negative HPV   Obstetric History OB History  Gravida Para Term Preterm AB Living  2 2 2     2   SAB IAB Ectopic Multiple Live Births        0 2    # Outcome Date GA Lbr Len/2nd Weight Sex Type Anes PTL Lv  2 Term 03/09/22 [redacted]w[redacted]d 07:37 / 02:27 8 lb 6 oz (3.8 kg) F Vag-Spont EPI  LIV  1 Term 08/20/15 107w6d 25:43 / 04:10 8 lb 0.2 oz (3.635 kg) M Vag-Vacuum EPI  LIV     Birth Comments: [id number]    Past Medical History:  Diagnosis Date   Asthma    Heart murmur    PMDD (premenstrual dysphoric disorder)    Vitamin D deficiency     Past Surgical History:  Procedure Laterality Date   HERNIA REPAIR  2006,2007   TONSILECTOMY/ADENOIDECTOMY WITH MYRINGOTOMY  09/01/1994   WISDOM TOOTH EXTRACTION Bilateral     Current Outpatient Medications on File Prior to Visit  Medication Sig Dispense Refill   busPIRone (BUSPAR) 5 MG tablet Take 1 tablet (5 mg total) by mouth 3 (three) times daily. (Patient not taking: Reported on 05/19/2023) 90 tablet 1   No current facility-administered medications on file prior to visit.    Allergies  Allergen Reactions   Cetirizine Other (See Comments)    Reaction:  Fatigue  fatigue    Social History:  reports that she has never smoked. She has never used smokeless tobacco. She reports  that she does not currently use alcohol. She reports that she does not use drugs.  Family History  Problem Relation Age of Onset   Hyperlipidemia Mother    Alcohol abuse Father    Depression Brother    Alcohol abuse Maternal Aunt    Alcohol abuse Maternal Uncle    Alcohol abuse Paternal Aunt    Hyperlipidemia Maternal Grandmother    Alcohol abuse Maternal Grandfather    Cancer Maternal Grandfather    Cancer Paternal Grandmother    Alcohol abuse Paternal Aunt     The following portions of the patient's history were reviewed and updated as appropriate: allergies, current medications, past family history, past medical history, past social history, past surgical history and problem list.  Review of Systems Pertinent items noted in HPI and remainder of comprehensive ROS otherwise negative.  Physical Exam:  BP 116/76   Pulse 96   Ht 5\' 2"  (1.575 m)   Wt 104 lb (47.2 kg)   LMP 04/30/2023   BMI 19.02 kg/m  CONSTITUTIONAL: Well-developed, well-nourished female in no acute distress.  HENT:  Normocephalic, atraumatic, External right and left ear normal.  EYES: Conjunctivae and EOM are normal. Pupils are equal, round, and reactive to light. No scleral icterus.  NECK: Normal range of motion, supple, no masses.  Normal thyroid.  SKIN: Skin is warm and  dry. No rash noted. Not diaphoretic. No erythema. No pallor. MUSCULOSKELETAL: Normal range of motion. No tenderness.  No cyanosis, clubbing, or edema. NEUROLOGIC: Alert and oriented to person, place, and time. Normal reflexes, muscle tone coordination.  PSYCHIATRIC: Normal mood and affect. Normal behavior. Normal judgment and thought content. CARDIOVASCULAR: Normal heart rate noted, regular rhythm RESPIRATORY: Clear to auscultation bilaterally. Effort and breath sounds normal, no problems with respiration noted. BREASTS: Symmetric in size. No masses, tenderness, skin changes, nipple drainage, or lymphadenopathy bilaterally. Performed in the  presence of a chaperone. ABDOMEN: Soft, no distention noted.  No tenderness, rebound or guarding.  PELVIC:  Deferred  Assessment and Plan:   1. Postpartum anxiety  - recommend regular scheduled therapy - Rx: Wellbutrin - 2 week mood check   2. Women's annual routine gynecological examination  -Pap not due today - Rx: Slynd (2 month supply of sample packs provided) she has no insurance currently.  - Mammo starting at age 33  Ruthvik Barnaby, Harolyn Rutherford, NP [employer] for [employer], [facility]

## 2023-05-19 NOTE — Progress Notes (Signed)
Last pap- 10/24/20- negative`

## 2023-06-08 ENCOUNTER — Encounter: Payer: Self-pay | Admitting: Sports Medicine

## 2023-06-08 ENCOUNTER — Ambulatory Visit: Payer: Self-pay | Admitting: Sports Medicine

## 2023-06-08 ENCOUNTER — Other Ambulatory Visit (INDEPENDENT_AMBULATORY_CARE_PROVIDER_SITE_OTHER): Payer: Self-pay

## 2023-06-08 DIAGNOSIS — M654 Radial styloid tenosynovitis [de Quervain]: Secondary | ICD-10-CM

## 2023-06-08 NOTE — Progress Notes (Signed)
    Procedures performed today:    Procedure: Real-time Ultrasound Guided injection of the right first extensor compartment Device: Samsung HS60  Verbal informed consent obtained.  Time-out conducted.  Noted no overlying erythema, induration, or other signs of local infection.  Skin prepped in a sterile fashion.  Local anesthesia: Topical Ethyl chloride.  With sterile technique and under real time ultrasound guidance: Noted first extensor compartment tenosynovitis, 1 cc Kenalog 40, 1 cc lidocaine, 1 cc bupivacaine injected easily Completed without difficulty  Advised to call if fevers/chills, erythema, induration, drainage, or persistent bleeding.  Images permanently stored and available for review in PACS.  Impression: Technically successful ultrasound guided injection.  Independent interpretation of notes and tests performed by another provider:   None.  Brief History, Exam, Impression, and Recommendations:    Tendinitis, de Quervain's This is a very pleasant 37 year old female, she is at this point she is approximately 15 months postpartum, we saw her back in August with increasing pain radial aspect of the right wrist with swelling, she had positive Lourena Simmonds sign all concerning for de Quervain's tendinitis. We switched her to a thumb spica brace and added ibuprofen, home physical therapy, unfortunately she continues to have discomfort, today I did a first extensor compartment injection with ultrasound guidance, continue home PT, return to see me in 6 weeks.    ____________________________________________ Ihor Austin. Benjamin Stain, M.D., ABFM., CAQSM., AME. Primary Care and Sports Medicine [facility]  Adjunct Professor of Family Medicine  [employer] [facility] [employer]  Restaurant manager, fast food

## 2023-06-08 NOTE — Assessment & Plan Note (Signed)
This is a very pleasant 37 year old female, she is at this point she is approximately 15 months postpartum, we saw her back in August with increasing pain radial aspect of the right wrist with swelling, she had positive Lourena Simmonds sign all concerning for de Quervain's tendinitis. We switched her to a thumb spica brace and added ibuprofen, home physical therapy, unfortunately she continues to have discomfort, today I did a first extensor compartment injection with ultrasound guidance, continue home PT, return to see me in 6 weeks.

## 2023-06-30 MED ORDER — SERTRALINE HCL 50 MG PO TABS: 50.0000 mg | ORAL_TABLET | Freq: Every day | ORAL | 1 refills | Status: DC

## 2023-07-20 ENCOUNTER — Ambulatory Visit: Payer: Self-pay | Admitting: Sports Medicine

## 2024-05-03 ENCOUNTER — Encounter: Payer: Self-pay | Admitting: Sports Medicine

## 2024-07-19 ENCOUNTER — Encounter: Payer: Self-pay | Admitting: Physician Assistant

## 2024-07-20 MED ORDER — SERTRALINE HCL 50 MG PO TABS: 50.0000 mg | ORAL_TABLET | Freq: Every day | ORAL | 0 refills | Status: DC

## 2024-07-20 NOTE — Addendum Note (Signed)
 Addended by: ANTONIETTE VERMELL CROME on: 07/20/2024 03:47 PM   Modules accepted: Orders

## 2024-08-08 ENCOUNTER — Ambulatory Visit: Payer: Self-pay | Admitting: Physician Assistant

## 2024-08-16 ENCOUNTER — Encounter: Payer: Self-pay | Admitting: Physician Assistant

## 2024-08-16 ENCOUNTER — Ambulatory Visit: Payer: Self-pay | Admitting: Physician Assistant

## 2024-08-16 VITALS — BP 116/69 | HR 67 | Ht 62.0 in | Wt 110.0 lb

## 2024-08-16 DIAGNOSIS — F411 Generalized anxiety disorder: Secondary | ICD-10-CM

## 2024-08-16 DIAGNOSIS — F4010 Social phobia, unspecified: Secondary | ICD-10-CM | POA: Insufficient documentation

## 2024-08-16 DIAGNOSIS — G479 Sleep disorder, unspecified: Secondary | ICD-10-CM

## 2024-08-16 DIAGNOSIS — F330 Major depressive disorder, recurrent, mild: Secondary | ICD-10-CM | POA: Insufficient documentation

## 2024-08-16 MED ORDER — SERTRALINE HCL 100 MG PO TABS: 100.0000 mg | ORAL_TABLET | Freq: Every day | ORAL | 1 refills | Status: AC

## 2024-08-16 NOTE — Progress Notes (Signed)
 Established Patient Office Visit  Subjective   Patient ID: Melissa Mcclain Mcclain, female    DOB: 06/30/1986  Age: 38 y.o. MRN: [id number]  Chief Complaint  Patient presents with   Medical Management of Chronic Issues    HPI .Discussed the use of AI scribe software for clinical note transcription with the patient, who gave verbal consent to proceed.  History of Present Illness Melissa Mcclain Mcclain is a 38 year old female with anxiety and depression who presents with worsening anxiety symptoms.  Anxiety symptoms - Generalized anxiety affecting multiple aspects of life, including social situations and concerns about children's safety - Pervasive worry about a wide range of topics - Obsessive-compulsive tendencies with rumination and difficulty stopping negative thoughts, especially after exposure to distressing information - Worsening anxiety symptoms over time, particularly around the menstrual period - Constant state of heightened stress, described as 'fight or flight' and feeling as though the body is 'under attack' - No panic attacks, but persistent sense of being in a heightened state of stress  Depressive symptoms - Depression well controlled on current medication regimen of zoloft .   Psychotropic medication use and response - Currently taking a low dose of Zoloft , effective for depression but not as much for anxiety - Previously trialed Wellbutrin , discontinued due to sleep disturbances - Cautious with medication use due to breastfeeding  Menstrual-related symptom exacerbation - Anxiety symptoms intensify around the menstrual period  Breastfeeding considerations - Currently breastfeeding from one side only due to child refusing the other side secondary to low milk supply - Avoids certain medications to minimize exposure to breastfeeding child  Impact of physical activity and environmental stressors - Engages in physical activity by walking with dog and stroller - Recent confinement  at home due to child's illness has exacerbated anxiety symptoms    ROS See HPI.    Objective:     BP 116/69   Pulse 67   Ht 5' 2 (1.575 m)   Wt 110 lb (49.9 kg)   SpO2 99%   BMI 20.12 kg/m  BP Readings from Last 3 Encounters:  08/16/24 116/69  05/19/23 116/76  07/21/22 114/80   Wt Readings from Last 3 Encounters:  08/16/24 110 lb (49.9 kg)  05/19/23 104 lb (47.2 kg)  07/21/22 110 lb (49.9 kg)    ..    08/16/2024   10:13 AM 12/09/2022    9:23 AM 03/24/2022    2:33 PM 08/12/2021   11:05 AM 10/24/2020   10:59 AM  Depression screen PHQ 2/9  Decreased Interest 1 0 0 3 1  Down, Depressed, Hopeless 1 0 0 3 1  PHQ - 2 Score 2 0 0 6 2  Altered sleeping 2 3 0 1 2  Tired, decreased energy 3 2 0 3 2  Change in appetite 1 0 0 3 1  Feeling bad or failure about yourself  2 2 1 1  0  Trouble concentrating 2 3 0 0 2  Moving slowly or fidgety/restless 1 0 0 0 1  Suicidal thoughts 0 0 0 0 0  PHQ-9 Score 13 10  1  14  10    Difficult doing work/chores Extremely dIfficult Somewhat difficult   Somewhat difficult     Data saved with a previous flowsheet row definition   ..    08/16/2024   10:13 AM 12/09/2022    9:25 AM 03/24/2022    2:36 PM 08/12/2021   11:06 AM  GAD 7 : Generalized Anxiety Score  Nervous, Anxious, on Edge 3  3 1 2   Control/stop worrying 3 3 1 1   Worry too much - different things 3 3 1  0  Trouble relaxing 3 3 1  0  Restless 0 2 0 0  Easily annoyed or irritable 3 3 1 2   Afraid - awful might happen 3 3 1  0  Total GAD 7 Score 18 20 6 5   Anxiety Difficulty Extremely difficult Extremely difficult        Physical Exam Constitutional:      Appearance: Normal appearance.  HENT:     Head: Normocephalic.  Cardiovascular:     Rate and Rhythm: Normal rate and regular rhythm.  Pulmonary:     Effort: Pulmonary effort is normal.     Breath sounds: Normal breath sounds.  Musculoskeletal:     Cervical back: Normal range of motion and neck supple.  Neurological:      General: No focal deficit present.     Mental Status: She is alert and oriented to person, place, and time.  Psychiatric:        Mood and Affect: Mood normal.        Assessment & Plan:  Melissa Mcclain Mcclain was seen today for medical management of chronic issues.  Diagnoses and all orders for this visit:  GAD (generalized anxiety disorder) -     sertraline  (ZOLOFT ) 100 MG tablet; Take 1 tablet (100 mg total) by mouth daily. -     CMP14+EGFR  Social anxiety disorder -     sertraline  (ZOLOFT ) 100 MG tablet; Take 1 tablet (100 mg total) by mouth daily. -     CMP14+EGFR  Mild episode of recurrent major depressive disorder -     sertraline  (ZOLOFT ) 100 MG tablet; Take 1 tablet (100 mg total) by mouth daily. -     CMP14+EGFR  Trouble in sleeping -     CMP14+EGFR   Assessment & Plan Generalized anxiety disorder and social anxiety disorder GAD not to goal.  Significant anxiety, especially social anxiety and rumination. Anxiety exacerbated premenstrually. Current low-dose Zoloft  effective for depression but not anxiety. Discussed Buspar  for acute episodes but deferred due to breastfeeding. - Increased Zoloft  to 100 mg daily. - Consider Buspar  post-breastfeeding for anxiety. - CMP ordered for medication management.   Major depressive disorder, recurrent, mild PHQ not to goal Zoloft  more effective for depression but not to goal.  - Increased Zoloft  to 100 mg daily. - CMP ordered for medication management.   Sleep disorder - Magnesium gylcinate 400mg  at bedtime.  - discussed good sleep routine.     Return in about 3 months (around 11/14/2024).    Nyra Anspaugh, PA-C

## 2024-08-16 NOTE — Patient Instructions (Signed)
Magnesium Glycinate 200-400 mg at bedtime

## 2024-08-17 ENCOUNTER — Ambulatory Visit: Payer: Self-pay | Admitting: Physician Assistant

## 2024-08-17 LAB — CMP14+EGFR
ALT: 10 IU/L (ref 0–32)
AST: 18 IU/L (ref 0–40)
Albumin: 4.6 g/dL (ref 3.9–4.9)
Alkaline Phosphatase: 52 IU/L (ref 41–116)
BUN/Creatinine Ratio: 19 (ref 9–23)
BUN: 13 mg/dL (ref 6–20)
Bilirubin Total: 0.3 mg/dL (ref 0.0–1.2)
CO2: 22 mmol/L (ref 20–29)
Calcium: 9.9 mg/dL (ref 8.7–10.2)
Chloride: 105 mmol/L (ref 96–106)
Creatinine, Ser: 0.7 mg/dL (ref 0.57–1.00)
Globulin, Total: 2 g/dL (ref 1.5–4.5)
Glucose: 77 mg/dL (ref 70–99)
Potassium: 4.2 mmol/L (ref 3.5–5.2)
Sodium: 141 mmol/L (ref 134–144)
Total Protein: 6.6 g/dL (ref 6.0–8.5)
eGFR: 113 mL/min/1.73 (ref >59)

## 2024-08-17 NOTE — Progress Notes (Signed)
 [place],   Kidney, liver, glucose looks good.

## 2024-08-26 ENCOUNTER — Other Ambulatory Visit: Payer: Self-pay | Admitting: Physician Assistant

## 2024-11-14 ENCOUNTER — Ambulatory Visit: Payer: Self-pay | Admitting: Physician Assistant
